# Patient Record
Sex: Female | Born: 1985 | Race: Black or African American | Hispanic: No | Smoking: Former smoker
Health system: Southern US, Community
[De-identification: ages and names within clinical notes are randomized; demographics above are authoritative.]

---

## 2006-11-14 ENCOUNTER — Emergency Department (HOSPITAL_COMMUNITY): Admission: EM | Admit: 2006-11-14 | Discharge: 2006-11-15 | Payer: Self-pay | Admitting: Emergency Medicine

## 2007-02-16 ENCOUNTER — Ambulatory Visit (HOSPITAL_COMMUNITY): Admission: RE | Admit: 2007-02-16 | Discharge: 2007-02-16 | Payer: Self-pay | Admitting: Obstetrics and Gynecology

## 2007-05-18 ENCOUNTER — Inpatient Hospital Stay (HOSPITAL_COMMUNITY): Admission: AD | Admit: 2007-05-18 | Discharge: 2007-05-21 | Payer: Self-pay | Admitting: Obstetrics and Gynecology

## 2010-12-14 NOTE — Discharge Summary (Signed)
NAMEMARLON, VONRUDEN                ACCOUNT NO.:  [id number]   MEDICAL RECORD NO.:  [id number]          PATIENT TYPE:  INP   LOCATION:  9127                          FACILITY:  [facility]   PHYSICIAN:  Malachi Pro. Ambrose Mantle, M.D. DATE OF BIRTH:  [birth year]   DATE OF ADMISSION:  05/18/2007  DATE OF DISCHARGE:  05/21/2007                               DISCHARGE SUMMARY   A 25 year old black single female, para 0-1-1-1, gravida 3, EDC May 28, 2007, admitted at 8 cm dilatation.  Her blood group and type was O+,  negative antibody, sickle cell negative, RPR nonreactive, rubella  immune, hepatitis B surface antigen negative, HIV negative, GC and  chlamydia negative, quad screen negative.  One-hour Glucola 81.  Group B  strep positive.  Cystic fibrosis negative. Ultrasound on January 02, 2007:  18 weeks 4 days, [facility] June 01, 2007. Repeat ultrasound on February 16, 2007, average gestational age [redacted] weeks 3 days, [facility] May 29, 2007.  Bilateral upper extremity polydactyly was seen.  The patient was treated  with Flagyl and Terazol for bacterial vaginosis and yeast. Prenatal care  was essentially uncomplicated.  On May 17, 2007, the cervix was 2-3  cm, 50%, vertex presentation. At approximately 9:30 p.m., the patient  began contracting. She came to the maternity admission unit was found to  be 8 cm dilated.  She was admitted, and, because of her positive group B  strep status and advanced cervical dilatation, she was begun on  ampicillin rather than penicillin for speed of administration. She  requested an epidural and was prepared for that.   PAST MEDICAL HISTORY:  Revealed no allergies.  No illnesses.  No  operations.  No alcohol, tobacco or drugs.   FAMILY HISTORY:  Maternal grandmother with high blood pressure.   OBSTETRICAL HISTORY:  1. In April 2005, the patient delivered a 6 pounds 2 ounces female      vaginally at 36 weeks and 6 days.  2. In 2007 early abortion.   PHYSICAL EXAMINATION ON  ADMISSION:  Revealed normal vital signs.  Heart  and lungs were normal.  The abdomen was soft. Fundal height was 37 cm.  Fetal heart tones were normal.  The cervix was 8 cm, 100% effaced,  vertex at a zero station per the admitting nurse. Her membranes ruptured  spontaneously at 1:15 a.m.   Impressions were intrauterine pregnancy at 38+ weeks, advanced cervical  dilatation, positive group B strep.   HOSPITAL COURSE:  IV ampicillin was begun.  The anesthesiologist was  called for a stat C-section. As he was preparing to give the epidural,  the patient progressed rapidly to full dilatation with the vertex LOP.  She delivered spontaneously LOP over a first-degree laceration a living  female infant, 6 pounds 2 ounces with Apgars of 9 at 1 and 9 at 5  minutes.  Placenta was intact.  The uterus was normal. First-degree  laceration repaired with 3-0 Vicryl.  Blood loss about 400 mL. Dr.  Ambrose Mantle was in attendance.   Postpartum, the patient did quite well and was discharged on the second  postpartum day.  Laboratory data showed initial hemoglobin of 12.4,  hematocrit 36.9, white count 10,700, platelet count 171,000.  Followup  hemoglobin was 9.9.  RPR was nonreactive.   FINAL DIAGNOSES:  1. Intrauterine pregnancy at 38+ weeks, delivered left      occipitoposterior position.  2. Positive group B Streptococcus.   OPERATION:  1. Spontaneous delivery left occipitoposterior position.  2. Repair of first-degree laceration.   FINAL CONDITION:  Improved.   INSTRUCTIONS:  1. Our regular discharge instruction booklet.  2. Percocet 5/325, #30 tablets, one every 4-6 hours as needed for      pain.  3. The patient is advised to return in 6 weeks for followup      examination.      Malachi Pro. Ambrose Mantle, M.D.  Electronically Signed     TFH/MEDQ  D:  05/21/2007  T:  05/21/2007  Job:  [id number]

## 2011-05-11 LAB — CBC
HCT: 36.9
Hemoglobin: 9.9 — ABNORMAL LOW
MCHC: 32.8
MCHC: 33.4
MCV: 86.8
Platelets: 162
Platelets: 171
RDW: 13.3
RDW: 13.9
WBC: 10.7 — ABNORMAL HIGH

## 2011-08-14 ENCOUNTER — Encounter (HOSPITAL_COMMUNITY): Payer: Self-pay | Admitting: *Deleted

## 2011-08-14 ENCOUNTER — Emergency Department (INDEPENDENT_AMBULATORY_CARE_PROVIDER_SITE_OTHER)
Admission: EM | Admit: 2011-08-14 | Discharge: 2011-08-14 | Disposition: A | Discharge status: Home / Self Care | Payer: Self-pay | Source: Home / Self Care

## 2011-08-14 DIAGNOSIS — L049 Acute lymphadenitis, unspecified: Secondary | ICD-10-CM

## 2011-08-14 MED ORDER — CEPHALEXIN 500 MG PO CAPS: 500.0000 mg | ORAL_CAPSULE | Freq: Three times a day (TID) | ORAL | Status: AC

## 2011-08-14 NOTE — ED Notes (Signed)
Pt with onset of sore throat  Thursday - swelling neck on Friday - swelling has improved - denies difficulty swallowing - sorethroat has resolved

## 2011-08-14 NOTE — ED Provider Notes (Signed)
History     CSN: [id number]  Arrival date & time 08/14/11  1723   None     Chief Complaint  Patient presents with  . Sore Throat  . Lymphadenopathy    (Consider location/radiation/quality/duration/timing/severity/associated sxs/prior treatment) HPI Comments: Sore throat 4 days ago - for only one day. The next day noticed a large nontender lump in her throat. Today that appears to be getting smaller. No dysphagia. She denies fever, nasal congestion or cough. She is not taking anything for her symptoms.    History reviewed. No pertinent past medical history.  History reviewed. No pertinent past surgical history.  History reviewed. No pertinent family history.  History  Substance Use Topics  . Smoking status: Never Smoker   . Smokeless tobacco: Not on file  . Alcohol Use: Yes    OB History    Grav Para Term Preterm Abortions TAB SAB Ect Mult Living                  Review of Systems  Constitutional: Negative for fever, chills and fatigue.  HENT: Negative for ear pain, sore throat, rhinorrhea, sneezing, mouth sores, trouble swallowing, neck pain, voice change, postnasal drip and sinus pressure.   Respiratory: Negative for cough.   Cardiovascular: Negative for chest pain.    Allergies  Review of patient's allergies indicates no known allergies.  Home Medications   Current Outpatient Rx  Name Route Sig Dispense Refill  . NYQUIL PO Oral Take by mouth.    . CEPHALEXIN 500 MG PO CAPS Oral Take 1 capsule (500 mg total) by mouth 3 (three) times daily. 30 capsule 0    BP 137/74  Pulse 63  Temp(Src) 99 F (37.2 C) (Oral)  Resp 16  SpO2 100%  LMP 08/09/2011  Physical Exam  Nursing note and vitals reviewed. Constitutional: She appears well-developed and well-nourished. No distress.  HENT:  Head: Normocephalic and atraumatic.  Right Ear: Tympanic membrane, external ear and ear canal normal.  Left Ear: Tympanic membrane, external ear and ear canal normal.  Nose:  Nose normal.  Mouth/Throat: Uvula is midline, oropharynx is clear and moist and mucous membranes are normal. No oropharyngeal exudate, posterior oropharyngeal edema or posterior oropharyngeal erythema.  Neck: Neck supple. No tracheal deviation present. No thyromegaly present.  Cardiovascular: Normal rate, regular rhythm and normal heart sounds.   Pulmonary/Chest: Effort normal and breath sounds normal. No respiratory distress.  Lymphadenopathy:       Head (right side): No submandibular and no tonsillar adenopathy present.       Head (left side): Submental adenopathy present. No submandibular and no tonsillar adenopathy present.    She has no cervical adenopathy.       Lt submental 1.5 cm hard smooth mobile nontender node.  Neurological: She is alert.  Skin: Skin is warm and dry.  Psychiatric: She has a normal mood and affect.    ED Course  Procedures (including critical care time)   Labs Reviewed  POCT RAPID STREP A ([facility] URG CARE ONLY)   No results found.   1. Lymphadenitis, acute       MDM  Solitary Lt submental adenopathy.         Melody Comas, [place] 08/19/11 1324

## 2011-08-19 NOTE — ED Provider Notes (Signed)
Medical screening examination/treatment/procedure(s) were performed by non-physician practitioner and as supervising physician I was immediately available for consultation/collaboration.   [facility]; MD   Sharin Grave, MD 08/19/11 1610

## 2013-01-04 ENCOUNTER — Emergency Department (INDEPENDENT_AMBULATORY_CARE_PROVIDER_SITE_OTHER)
Admission: EM | Admit: 2013-01-04 | Discharge: 2013-01-04 | Disposition: A | Discharge status: Home / Self Care | Payer: Self-pay | Source: Home / Self Care | Attending: Emergency Medicine | Admitting: Emergency Medicine

## 2013-01-04 ENCOUNTER — Encounter (HOSPITAL_COMMUNITY): Payer: Self-pay | Admitting: Emergency Medicine

## 2013-01-04 DIAGNOSIS — M791 Myalgia, unspecified site: Secondary | ICD-10-CM

## 2013-01-04 DIAGNOSIS — IMO0001 Reserved for inherently not codable concepts without codable children: Secondary | ICD-10-CM

## 2013-01-04 MED ORDER — IBUPROFEN 600 MG PO TABS: 600.0000 mg | ORAL_TABLET | Freq: Three times a day (TID) | ORAL | Status: DC | PRN

## 2013-01-04 NOTE — ED Provider Notes (Addendum)
History     CSN: [id number]  Arrival date & time 01/04/13  1418   First MD Initiated Contact with Patient 01/04/13 1604      Chief Complaint  Patient presents with  . Chest Pain    (Consider location/radiation/quality/duration/timing/severity/associated sxs/prior treatment) HPI Comments: Pt feels sx worse when takes bra off, like breast is pulling on area.   Patient is a 27 y.o. female presenting with chest pain. The history is provided by the patient.  Chest Pain Pain location:  L chest Pain quality: aching and tightness   Pain radiates to:  L shoulder Pain radiates to the back: no   Pain severity:  Mild Onset quality:  Gradual Duration:  2 weeks Timing:  Intermittent Progression:  Unchanged Chronicity:  New Relieved by:  None tried Exacerbated by: taking bra off. Ineffective treatments:  None tried Associated symptoms: no anxiety, no diaphoresis, no fever, no nausea, no palpitations and no shortness of breath     History reviewed. No pertinent past medical history.  History reviewed. No pertinent past surgical history.  History reviewed. No pertinent family history.  History  Substance Use Topics  . Smoking status: Current Every Day Smoker  . Smokeless tobacco: Not on file  . Alcohol Use: Yes    OB History   Grav Para Term Preterm Abortions TAB SAB Ect Mult Living                  Review of Systems  Constitutional: Negative for fever, chills and diaphoresis.  Respiratory: Negative for shortness of breath.   Cardiovascular: Positive for chest pain. Negative for palpitations and leg swelling.  Gastrointestinal: Negative for nausea.    Allergies  Review of patient's allergies indicates no known allergies.  Home Medications   Current Outpatient Rx  Name  Route  Sig  Dispense  Refill  . ibuprofen (ADVIL,MOTRIN) 600 MG tablet   Oral   Take 1 tablet (600 mg total) by mouth every 8 (eight) hours as needed for pain.   30 tablet   0   .  Pseudoeph-Doxylamine-DM-APAP (NYQUIL PO)   Oral   Take by mouth.           BP 150/77  Pulse 63  Temp(Src) 98.4 F (36.9 C) (Oral)  Resp 17  SpO2 100%  LMP 12/05/2012  Physical Exam  Constitutional: She appears well-developed and well-nourished. No distress.  Cardiovascular: Normal rate and regular rhythm.   Pulmonary/Chest: Effort normal and breath sounds normal. She exhibits tenderness. She exhibits no bony tenderness. Right breast exhibits no inverted nipple, no mass, no nipple discharge, no skin change and no tenderness. Left breast exhibits tenderness. Left breast exhibits no inverted nipple, no mass, no nipple discharge and no skin change.    Lymphadenopathy:       Right axillary: No pectoral and no lateral adenopathy present.       Left axillary: No pectoral and no lateral adenopathy present.      Right: No supraclavicular adenopathy present.       Left: No supraclavicular and no epitrochlear adenopathy present.    ED Course  Procedures (including critical care time)  Labs Reviewed - No data to display No results found.   1. Muscle pain       MDM  Most likely muscle pain. Breasts are not fibrocystic or dense. EKG NSR, 69bpm        Cathlyn Parsons, NP 01/04/13 1700  Cathlyn Parsons, NP 01/04/13 2143

## 2013-01-04 NOTE — ED Provider Notes (Signed)
Medical screening examination/treatment/procedure(s) were performed by non-physician practitioner and as supervising physician I was immediately available for consultation/collaboration.  Wyland Rastetter, M.D.  Masashi Snowdon C Khalfani Weideman, MD 01/04/13 2211 

## 2013-01-04 NOTE — ED Notes (Signed)
Reports chest tightness above left breast.  Sensation is tight, not pain.  Associates with pain in left arm.  Onset about 2 weeks ago.  Reports pain in left shoulder and arm is sharp.  Does not relate to any behavior the improvement or worsening of symptoms.  No diaphoresis, no sob, no nausea.  Reports "going to sleep" is the only thing that helps

## 2013-01-04 NOTE — ED Provider Notes (Signed)
Medical screening examination/treatment/procedure(s) were performed by non-physician practitioner and as supervising physician I was immediately available for consultation/collaboration.  Leslee Home, M.D.  Reuben Likes, MD 01/04/13 2104

## 2015-05-25 ENCOUNTER — Emergency Department (HOSPITAL_COMMUNITY): Payer: No Typology Code available for payment source

## 2015-05-25 ENCOUNTER — Emergency Department (HOSPITAL_COMMUNITY)
Admission: EM | Admit: 2015-05-25 | Discharge: 2015-05-25 | Disposition: A | Discharge status: Home / Self Care | Payer: No Typology Code available for payment source | Attending: Emergency Medicine | Admitting: Emergency Medicine

## 2015-05-25 ENCOUNTER — Encounter (HOSPITAL_COMMUNITY): Payer: Self-pay | Admitting: Cardiology

## 2015-05-25 DIAGNOSIS — Y998 Other external cause status: Secondary | ICD-10-CM | POA: Insufficient documentation

## 2015-05-25 DIAGNOSIS — R519 Headache, unspecified: Secondary | ICD-10-CM

## 2015-05-25 DIAGNOSIS — S0990XA Unspecified injury of head, initial encounter: Secondary | ICD-10-CM | POA: Insufficient documentation

## 2015-05-25 DIAGNOSIS — M25512 Pain in left shoulder: Secondary | ICD-10-CM

## 2015-05-25 DIAGNOSIS — S4992XA Unspecified injury of left shoulder and upper arm, initial encounter: Secondary | ICD-10-CM | POA: Diagnosis present

## 2015-05-25 DIAGNOSIS — Y9389 Activity, other specified: Secondary | ICD-10-CM | POA: Insufficient documentation

## 2015-05-25 DIAGNOSIS — Z87891 Personal history of nicotine dependence: Secondary | ICD-10-CM | POA: Diagnosis not present

## 2015-05-25 DIAGNOSIS — Y92410 Unspecified street and highway as the place of occurrence of the external cause: Secondary | ICD-10-CM | POA: Diagnosis not present

## 2015-05-25 DIAGNOSIS — R51 Headache: Secondary | ICD-10-CM

## 2015-05-25 MED ORDER — IBUPROFEN 400 MG PO TABS
800.0000 mg | ORAL_TABLET | Freq: Once | ORAL | Status: AC
  Administered 2015-05-25: 800 mg via ORAL
  Filled 2015-05-25: qty 2

## 2015-05-25 MED ORDER — IBUPROFEN 600 MG PO TABS: 600.0000 mg | ORAL_TABLET | Freq: Four times a day (QID) | ORAL | Status: DC | PRN
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
## 2015-05-25 NOTE — Discharge Instructions (Signed)
Please use ibuprofen, ice, rest. Please contact orthopedic specialist if symptoms continue to persist, please complete shoulder exercises.

## 2016-08-09 ENCOUNTER — Encounter (HOSPITAL_COMMUNITY): Payer: Self-pay | Admitting: Emergency Medicine

## 2016-08-09 ENCOUNTER — Emergency Department (HOSPITAL_COMMUNITY)
Admission: EM | Admit: 2016-08-09 | Discharge: 2016-08-09 | Disposition: A | Discharge status: Home / Self Care | Payer: Self-pay | Attending: Emergency Medicine | Admitting: Emergency Medicine

## 2016-08-09 DIAGNOSIS — K0889 Other specified disorders of teeth and supporting structures: Secondary | ICD-10-CM | POA: Insufficient documentation

## 2016-08-09 DIAGNOSIS — Z87891 Personal history of nicotine dependence: Secondary | ICD-10-CM | POA: Insufficient documentation

## 2016-08-09 MED ORDER — PENICILLIN V POTASSIUM 500 MG PO TABS: 500.0000 mg | ORAL_TABLET | Freq: Four times a day (QID) | ORAL | 0 refills | Status: AC

## 2016-08-09 MED ORDER — NAPROXEN 375 MG PO TABS: 375.0000 mg | ORAL_TABLET | Freq: Two times a day (BID) | ORAL | 0 refills | Status: DC

## 2016-08-09 NOTE — Discharge Instructions (Signed)
Please take the antibiotic as prescribed for 7 days. You need to take it 4 times a day. Make sure that you spread out the dose throughout the day. You may take the naproxen for pain. May also take Tylenol. You need a follow-up with a dentist. I have given you a dental resource guide. Return to the ED if you have any difficulty swallowing, difficulties breathing, develop fevers, worsening pain, worsening swelling.

## 2016-08-09 NOTE — ED Triage Notes (Signed)
Had a rt sided tooth ache  Yesterday woke up today and  Face on rt was swollen.  Cold air bothers it

## 2016-08-09 NOTE — ED Provider Notes (Signed)
[facility]-EMERGENCY DEPT Provider Note   CSN: [id number] Arrival date & time: 08/09/16  4098  By signing my name below, I, Majel Homer, attest that this documentation has been prepared under the direction and in the presence of Demetrios Loll, PA-C . Electronically Signed: Majel Homer, Scribe. 08/09/2016. 9:52 AM.  History   Chief Complaint Chief Complaint  Patient presents with  . Dental Pain   The history is provided by the patient. No language interpreter was used.   HPI Comments: Jody Brady is a 31 y.o. female who presents to the Emergency Department complaining of gradually improving, right upper dental pain that began last night. Pt reports she took Tylenol last night for her pain with complete relief and woke up this morning with "numbness" surrounding her tooth. She states associated mild right sided facial swelling that also began this morning. Cold air makes pain worse. She notes hx of similar dental pain ~1 year ago in which she visited a dentist and "had 3 teeth pulled." She states she does not currently have a dentist. Pt denies fever, shortness of breath, and difficulty swallowing.   History reviewed. No pertinent past medical history.  There are no active problems to display for this patient.  History reviewed. No pertinent surgical history.  OB History    No data available     Home Medications    Prior to Admission medications   Medication Sig Start Date End Date Taking? Authorizing Provider  ibuprofen (ADVIL,MOTRIN) 600 MG tablet Take 1 tablet (600 mg total) by mouth every 6 (six) hours as needed. 05/25/15   Jeffrey Hedges, PA-C  Pseudoeph-Doxylamine-DM-APAP (NYQUIL PO) Take by mouth.    Historical Provider, MD    Family History No family history on file.  Social History Social History  Substance Use Topics  . Smoking status: Former Games developer  . Smokeless tobacco: Never Used  . Alcohol use Yes     Allergies   Patient has no known allergies.  Review of  Systems Review of Systems  Constitutional: Negative for fever.  HENT: Positive for dental problem and facial swelling. Negative for trouble swallowing.   Respiratory: Negative for shortness of breath.   All other systems reviewed and are negative.  Physical Exam Updated Vital Signs BP 131/84 (BP Location: Right Arm)   Pulse 71   Temp 98.2 F (36.8 C) (Oral)   Resp 18   Ht 5\' 5"  (1.651 m)   Wt 160 lb (72.6 kg)   SpO2 100%   BMI 26.63 kg/m   Physical Exam  Constitutional: She is oriented to person, place, and time. She appears well-developed and well-nourished.  HENT:  Head: Normocephalic and atraumatic.  Right Ear: Tympanic membrane, external ear and ear canal normal.  Left Ear: Tympanic membrane, external ear and ear canal normal.  Nose: Nose normal.  Mouth/Throat: Uvula is midline, oropharynx is clear and moist and mucous membranes are normal. No trismus in the jaw.    No sublingual or submandibular swelling. Oropharynx is clear without any edema. Managing secretions and maintaining airway. No trismus. Uvula is midline. No gross abscess noted. Mild facial swelling of the right side. Mild tenderness to palpation over the tooth. No erythema, warmth of right side of face.  Eyes: EOM are normal.  Neck: Normal range of motion.  Pulmonary/Chest: Effort normal.  Abdominal: She exhibits no distension.  Musculoskeletal: Normal range of motion.  Neurological: She is alert and oriented to person, place, and time.  Psychiatric: She has a normal mood  and affect.  Nursing note and vitals reviewed.  ED Treatments / Results  Labs (all labs ordered are listed, but only abnormal results are displayed) Labs Reviewed - No data to display  EKG  EKG Interpretation None       Radiology No results found.  Procedures Procedures (including critical care time)  Medications Ordered in ED Medications - No data to display  DIAGNOSTIC STUDIES:  Oxygen Saturation is 100% on RA, normal  by my interpretation.    COORDINATION OF CARE:  9:51 AM Discussed treatment plan with pt at bedside and pt agreed to plan.  Initial Impression / Assessment and Plan / ED Course  I have reviewed the triage vital signs and the nursing notes.  Pertinent labs & imaging results that were available during my care of the patient were reviewed by me and considered in my medical decision making (see chart for details).  Clinical Course    Patient with dentalgia.  No abscess requiring immediate incision and drainage.  Exam not concerning for Ludwig's angina or pharyngeal abscess.  Patient managing her airway and maintaining secretions. Patient is afebrile and not tachycardic. Will treat with PCN and pain medication. Pt instructed to follow-up with dentist.  Discussed return precautions. Pt safe for discharge.  I personally performed the services described in this documentation, which was scribed in my presence. The recorded information has been reviewed and is accurate.   Final Clinical Impressions(s) / ED Diagnoses   Final diagnoses:  Pain, dental    New Prescriptions New Prescriptions   NAPROXEN (NAPROSYN) 375 MG TABLET    Take 1 tablet (375 mg total) by mouth 2 (two) times daily.   PENICILLIN V POTASSIUM (VEETID) 500 MG TABLET    Take 1 tablet (500 mg total) by mouth 4 (four) times daily.     Rise MuKenneth T Cadden Elizondo, PA-C 08/09/16 1002    Lavera Guiseana Duo Liu, MD 08/09/16 1725

## 2019-06-05 ENCOUNTER — Other Ambulatory Visit (HOSPITAL_COMMUNITY)
Admission: RE | Admit: 2019-06-05 | Discharge: 2019-06-05 | Disposition: A | Discharge status: Home / Self Care | Payer: 59 | Source: Ambulatory Visit | Attending: Family Medicine | Admitting: Family Medicine

## 2019-06-05 ENCOUNTER — Other Ambulatory Visit: Payer: Self-pay

## 2019-06-05 ENCOUNTER — Encounter: Payer: Self-pay | Admitting: Family Medicine

## 2019-06-05 ENCOUNTER — Ambulatory Visit: Payer: 59 | Admitting: Family Medicine

## 2019-06-05 VITALS — BP 120/70 | HR 63 | Temp 97.1°F | Ht 65.5 in | Wt 165.4 lb

## 2019-06-05 DIAGNOSIS — Z113 Encounter for screening for infections with a predominantly sexual mode of transmission: Secondary | ICD-10-CM

## 2019-06-05 DIAGNOSIS — Z13220 Encounter for screening for lipoid disorders: Secondary | ICD-10-CM | POA: Diagnosis not present

## 2019-06-05 DIAGNOSIS — Z1329 Encounter for screening for other suspected endocrine disorder: Secondary | ICD-10-CM

## 2019-06-05 DIAGNOSIS — Z23 Encounter for immunization: Secondary | ICD-10-CM

## 2019-06-05 DIAGNOSIS — Z124 Encounter for screening for malignant neoplasm of cervix: Secondary | ICD-10-CM | POA: Diagnosis present

## 2019-06-05 DIAGNOSIS — N898 Other specified noninflammatory disorders of vagina: Secondary | ICD-10-CM

## 2019-06-05 DIAGNOSIS — Z0000 Encounter for general adult medical examination without abnormal findings: Secondary | ICD-10-CM | POA: Diagnosis not present

## 2019-06-05 NOTE — Patient Instructions (Signed)
Preventive Care 21-34 Years Old, Female Preventive care refers to visits with your health care provider and lifestyle choices that can promote health and wellness. This includes:  A yearly physical exam. This may also be called an annual well check.  Regular dental visits and eye exams.  Immunizations.  Screening for certain conditions.  Healthy lifestyle choices, such as eating a healthy diet, getting regular exercise, not using drugs or products that contain nicotine and tobacco, and limiting alcohol use. What can I expect for my preventive care visit? Physical exam Your health care provider will check your:  Height and weight. This may be used to calculate body mass index (BMI), which tells if you are at a healthy weight.  Heart rate and blood pressure.  Skin for abnormal spots. Counseling Your health care provider may ask you questions about your:  Alcohol, tobacco, and drug use.  Emotional well-being.  Home and relationship well-being.  Sexual activity.  Eating habits.  Work and work environment.  Method of birth control.  Menstrual cycle.  Pregnancy history. What immunizations do I need?  Influenza (flu) vaccine  This is recommended every year. Tetanus, diphtheria, and pertussis (Tdap) vaccine  You may need a Td booster every 10 years. Varicella (chickenpox) vaccine  You may need this if you have not been vaccinated. Human papillomavirus (HPV) vaccine  If recommended by your health care provider, you may need three doses over 6 months. Measles, mumps, and rubella (MMR) vaccine  You may need at least one dose of MMR. You may also need a second dose. Meningococcal conjugate (MenACWY) vaccine  One dose is recommended if you are age 19-21 years and a first-year college student living in a residence hall, or if you have one of several medical conditions. You may also need additional booster doses. Pneumococcal conjugate (PCV13) vaccine  You may need  this if you have certain conditions and were not previously vaccinated. Pneumococcal polysaccharide (PPSV23) vaccine  You may need one or two doses if you smoke cigarettes or if you have certain conditions. Hepatitis A vaccine  You may need this if you have certain conditions or if you travel or work in places where you may be exposed to hepatitis A. Hepatitis B vaccine  You may need this if you have certain conditions or if you travel or work in places where you may be exposed to hepatitis B. Haemophilus influenzae type b (Hib) vaccine  You may need this if you have certain conditions. You may receive vaccines as individual doses or as more than one vaccine together in one shot (combination vaccines). Talk with your health care provider about the risks and benefits of combination vaccines. What tests do I need?  Blood tests  Lipid and cholesterol levels. These may be checked every 5 years starting at age 20.  Hepatitis C test.  Hepatitis B test. Screening  Diabetes screening. This is done by checking your blood sugar (glucose) after you have not eaten for a while (fasting).  Sexually transmitted disease (STD) testing.  BRCA-related cancer screening. This may be done if you have a family history of breast, ovarian, tubal, or peritoneal cancers.  Pelvic exam and Pap test. This may be done every 3 years starting at age 21. Starting at age 30, this may be done every 5 years if you have a Pap test in combination with an HPV test. Talk with your health care provider about your test results, treatment options, and if necessary, the need for more tests.   Follow these instructions at home: Eating and drinking   Eat a diet that includes fresh fruits and vegetables, whole grains, lean protein, and low-fat dairy.  Take vitamin and mineral supplements as recommended by your health care provider.  Do not drink alcohol if: ? Your health care provider tells you not to drink. ? You are  pregnant, may be pregnant, or are planning to become pregnant.  If you drink alcohol: ? Limit how much you have to 0-1 drink a day. ? Be aware of how much alcohol is in your drink. In the [place] one drink equals one 12 oz bottle of beer (355 mL), one 5 oz glass of wine (148 mL), or one 1 oz glass of hard liquor (44 mL). Lifestyle  Take daily care of your teeth and gums.  Stay active. Exercise for at least 30 minutes on 5 or more days each week.  Do not use any products that contain nicotine or tobacco, such as cigarettes, e-cigarettes, and chewing tobacco. If you need help quitting, ask your health care provider.  If you are sexually active, practice safe sex. Use a condom or other form of birth control (contraception) in order to prevent pregnancy and STIs (sexually transmitted infections). If you plan to become pregnant, see your health care provider for a preconception visit. What's next?  Visit your health care provider once a year for a well check visit.  Ask your health care provider how often you should have your eyes and teeth checked.  Stay up to date on all vaccines. This information is not intended to replace advice given to you by your health care provider. Make sure you discuss any questions you have with your health care provider. Document Released: 09/13/2001 Document Revised: 03/29/2018 Document Reviewed: 03/29/2018 Elsevier Patient Education  2020 Elsevier Inc.  

## 2019-06-05 NOTE — Progress Notes (Signed)
Subjective:    Patient ID: Jody Brady, female    DOB: 1986-03-09, 33 y.o.   MRN: [id number]  HPI Chief Complaint  Patient presents with  . new pt    new pt, cpe. think she has yeast infection. no obgyn.Marland Kitchen declines flu shot   She is new to the practice and here for a complete physical exam. Moved to [place] 12 years ago.  Previous medical care: no PCP or regular care  Last CPE: 2 years ago.   Other providers: Dentist   Social history: Lives with boyfriend, son and daughter, works at [employer]  Denies smoking, drug use. Alcohol use social.  Diet: has cut back on soda. Green tea and water. No particular diet  Excerise: none   Depo Provera in the past and birth control pills before. Does not want birth control   Immunizations: flu shot declines. Tdap unknown.   Health maintenance:  Mammogram: N/A Colonoscopy: N/A Last Gynecological Exam: 2-3  years ago. Denies having abnormal pap smears in the past.  Last Menstrual cycle: 05/31/2019 Last Dental Exam: in 2020  Last Eye Exam:  Years ago   Wears seatbelt always, smoke detectors in home and functioning, does not text while driving and feels safe in home environment.   Reviewed allergies, medications, past medical, surgical, family, and social history.   Review of Systems Review of Systems Constitutional: -fever, -chills, -sweats, -unexpected weight change,-fatigue ENT: -runny nose, -ear pain, -sore throat Cardiology:  -chest pain, -palpitations, -edema Respiratory: -cough, -shortness of breath, -wheezing Gastroenterology: -abdominal pain, -nausea, -vomiting, -diarrhea, -constipation  Hematology: -bleeding or bruising problems Musculoskeletal: -arthralgias, -myalgias, -joint swelling, -back pain Ophthalmology: -vision changes Urology: -dysuria, -difficulty urinating, -hematuria, -urinary frequency, -urgency Neurology: -headache, -weakness, -tingling, -numbness       Objective:   Physical Exam  BP 120/70   Pulse 63   Temp (!) 97.1 F (36.2 C)   Ht 5' 5.5" (1.664 m)   Wt 165 lb 6.4 oz (75 kg)   LMP 05/31/2019   BMI 27.11 kg/m   General Appearance:    Alert, cooperative, no distress, appears stated age  Head:    Normocephalic, without obvious abnormality, atraumatic  Eyes:    PERRL, conjunctiva/corneas clear, EOM's intact  Ears:    Normal TM's and external ear canals  Nose:   Mask in place   Throat:   Mask in place   Neck:   Supple, no lymphadenopathy;  thyroid:  no   enlargement/tenderness/nodules; no carotid   bruit or JVD  Back:    Spine nontender, no curvature, ROM normal, no CVA     tenderness  Lungs:     Clear to auscultation bilaterally without wheezes, rales or     ronchi; respirations unlabored  Chest Wall:    No tenderness or deformity   Heart:    Regular rate and rhythm, S1 and S2 normal, no murmur, rub   or gallop  Breast Exam:    No tenderness, masses, or nipple discharge or inversion.      No axillary lymphadenopathy  Abdomen:     Soft, non-tender, nondistended, normoactive bowel sounds,    no masses, no hepatosplenomegaly  Genitalia:    Normal external genitalia without lesions.  BUS and vagina normal; cervix without lesions, or cervical motion tenderness. No abnormal vaginal discharge.  Uterus and adnexa not enlarged, nontender, no masses.  Pap performed. Chaperone present.   Rectal:    Not performed due to age<40 and no related complaints  Extremities:   No clubbing, cyanosis or edema  Pulses:   2+ and symmetric all extremities  Skin:   Skin color, texture, turgor normal, no rashes or lesions  Lymph nodes:   Cervical, supraclavicular, and axillary nodes normal  Neurologic:   CNII-XII intact, normal strength, sensation and gait; reflexes 2+ and symmetric throughout          Psych:   Normal mood, affect, hygiene and grooming.         Assessment & Plan:  Routine general medical examination at a health care facility - Plan: CBC with Differential/Platelet,  Comprehensive metabolic panel, TSH, T4, free, Lipid panel -She is new to the practice and here today for a fasting CPE. Regular medical care in years. Preventive healthcare reviewed and updated.  Pap smear was done.  Immunizations reviewed and updated as well.  She declines flu shot. Counseled on healthy lifestyle and safety Counseling on birth control and she declines.  States she would be fine if she were to get pregnant.  Screening for thyroid disorder - Plan: TSH, T4, free  Screening for lipid disorders - Plan: Lipid panel  Screen for STD (sexually transmitted disease) - Plan: HIV Antibody (routine testing w rflx), RPR, NuSwab Vaginitis Plus (VG+)  Need for diphtheria-tetanus-pertussis (Tdap) vaccine - Plan: Tdap vaccine greater than or equal to 7yo IM -Counseling done with positive vaccine  Screening for cervical cancer - Plan: Cytology - PAP([place]) -Done per guidelines.  Chaperone present.   Vaginal discharge - Plan: NuSwab Vaginitis Plus (VG+) -This has been ongoing for weeks per patient.  Screen for STDs and follow-up

## 2019-06-06 LAB — CBC WITH DIFFERENTIAL/PLATELET
Basophils Absolute: 0 10*3/uL (ref 0.0–0.2)
Basos: 1 %
EOS (ABSOLUTE): 0.1 10*3/uL (ref 0.0–0.4)
Eos: 2 %
Hematocrit: 36.7 % (ref 34.0–46.6)
Hemoglobin: 12.3 g/dL (ref 11.1–15.9)
Immature Grans (Abs): 0 10*3/uL (ref 0.0–0.1)
Immature Granulocytes: 0 %
Lymphocytes Absolute: 2.5 10*3/uL (ref 0.7–3.1)
Lymphs: 47 %
MCH: 29.4 pg (ref 26.6–33.0)
MCHC: 33.5 g/dL (ref 31.5–35.7)
MCV: 88 fL (ref 79–97)
Monocytes Absolute: 0.4 10*3/uL (ref 0.1–0.9)
Monocytes: 8 %
Neutrophils Absolute: 2.2 10*3/uL (ref 1.4–7.0)
Neutrophils: 42 %
Platelets: 278 10*3/uL (ref 150–450)
RBC: 4.19 x10E6/uL (ref 3.77–5.28)
RDW: 12.5 % (ref 11.7–15.4)
WBC: 5.2 10*3/uL (ref 3.4–10.8)

## 2019-06-06 LAB — LIPID PANEL
Chol/HDL Ratio: 1.7 ratio (ref 0.0–4.4)
Cholesterol, Total: 134 mg/dL (ref 100–199)
HDL: 79 mg/dL (ref >39)
LDL Chol Calc (NIH): 45 mg/dL (ref 0–99)
Triglycerides: 41 mg/dL (ref 0–149)
VLDL Cholesterol Cal: 10 mg/dL (ref 5–40)

## 2019-06-06 LAB — COMPREHENSIVE METABOLIC PANEL
ALT: 19 IU/L (ref 0–32)
AST: 19 IU/L (ref 0–40)
Albumin/Globulin Ratio: 1.8 (ref 1.2–2.2)
Albumin: 4.1 g/dL (ref 3.8–4.8)
Alkaline Phosphatase: 57 IU/L (ref 39–117)
BUN/Creatinine Ratio: 18 (ref 9–23)
BUN: 11 mg/dL (ref 6–20)
Bilirubin Total: 0.6 mg/dL (ref 0.0–1.2)
CO2: 21 mmol/L (ref 20–29)
Calcium: 9.3 mg/dL (ref 8.7–10.2)
Chloride: 105 mmol/L (ref 96–106)
Creatinine, Ser: 0.61 mg/dL (ref 0.57–1.00)
GFR calc Af Amer: 137 mL/min/{1.73_m2} (ref >59)
GFR calc non Af Amer: 119 mL/min/{1.73_m2} (ref >59)
Globulin, Total: 2.3 g/dL (ref 1.5–4.5)
Glucose: 79 mg/dL (ref 65–99)
Potassium: 3.7 mmol/L (ref 3.5–5.2)
Sodium: 139 mmol/L (ref 134–144)
Total Protein: 6.4 g/dL (ref 6.0–8.5)

## 2019-06-06 LAB — RPR: RPR Ser Ql: NONREACTIVE

## 2019-06-06 LAB — HIV ANTIBODY (ROUTINE TESTING W REFLEX): HIV Screen 4th Generation wRfx: NONREACTIVE

## 2019-06-06 LAB — T4, FREE: Free T4: 1.24 ng/dL (ref 0.82–1.77)

## 2019-06-06 LAB — TSH: TSH: 0.986 u[IU]/mL (ref 0.450–4.500)

## 2019-06-07 ENCOUNTER — Other Ambulatory Visit: Payer: Self-pay | Admitting: Family Medicine

## 2019-06-07 DIAGNOSIS — B9689 Other specified bacterial agents as the cause of diseases classified elsewhere: Secondary | ICD-10-CM

## 2019-06-07 DIAGNOSIS — N760 Acute vaginitis: Secondary | ICD-10-CM

## 2019-06-07 LAB — NUSWAB VAGINITIS PLUS (VG+)
Atopobium vaginae: HIGH Score — AB
BVAB 2: HIGH Score — AB
Candida albicans, NAA: NEGATIVE
Candida glabrata, NAA: NEGATIVE
Chlamydia trachomatis, NAA: NEGATIVE
Megasphaera 1: HIGH Score — AB
Neisseria gonorrhoeae, NAA: NEGATIVE
Trich vag by NAA: NEGATIVE

## 2019-06-07 MED ORDER — METRONIDAZOLE 500 MG PO TABS: 500.0000 mg | ORAL_TABLET | Freq: Two times a day (BID) | ORAL | 0 refills | Status: DC

## 2019-06-10 LAB — CYTOLOGY - PAP
Comment: NEGATIVE
Diagnosis: NEGATIVE
High risk HPV: NEGATIVE

## 2020-01-13 ENCOUNTER — Ambulatory Visit (INDEPENDENT_AMBULATORY_CARE_PROVIDER_SITE_OTHER): Payer: 59 | Admitting: Family Medicine

## 2020-01-13 ENCOUNTER — Other Ambulatory Visit: Payer: Self-pay

## 2020-01-13 ENCOUNTER — Encounter: Payer: Self-pay | Admitting: Family Medicine

## 2020-01-13 VITALS — BP 128/80 | HR 66 | Temp 97.8°F | Wt 174.6 lb

## 2020-01-13 DIAGNOSIS — R110 Nausea: Secondary | ICD-10-CM

## 2020-01-13 DIAGNOSIS — N926 Irregular menstruation, unspecified: Secondary | ICD-10-CM | POA: Diagnosis not present

## 2020-01-13 DIAGNOSIS — R350 Frequency of micturition: Secondary | ICD-10-CM

## 2020-01-13 DIAGNOSIS — Z3201 Encounter for pregnancy test, result positive: Secondary | ICD-10-CM

## 2020-01-13 DIAGNOSIS — N3001 Acute cystitis with hematuria: Secondary | ICD-10-CM | POA: Diagnosis not present

## 2020-01-13 LAB — POCT URINALYSIS DIP (PROADVANTAGE DEVICE)
Bilirubin, UA: NEGATIVE
Glucose, UA: NEGATIVE mg/dL
Ketones, POC UA: NEGATIVE mg/dL
Nitrite, UA: NEGATIVE
Protein Ur, POC: NEGATIVE mg/dL
Specific Gravity, Urine: 1.015
Urobilinogen, Ur: 0.2
pH, UA: 6 (ref 5.0–8.0)

## 2020-01-13 LAB — POCT URINE PREGNANCY: Preg Test, Ur: POSITIVE — AB

## 2020-01-13 MED ORDER — CEPHALEXIN 500 MG PO CAPS: 500.0000 mg | ORAL_CAPSULE | Freq: Four times a day (QID) | ORAL | 0 refills | Status: DC

## 2020-01-13 NOTE — Progress Notes (Signed)
° °  Subjective:    Patient ID: Jody Brady, female    DOB: Jun 20, 1986, 34 y.o.   MRN: [id number]  HPI Chief Complaint  Patient presents with   other    possible UTI started about two weeks ago   Complains of a 2 week history of urinary frequency, urgency, and dysuria. States her urine has had a bad odor and cloudy.  No fever, chills, back pain, vomiting or diarrhea.  Denies history of recurrent UTI or pyelonephritis.   States she had nausea this morning.  She had lower abd cramping that resolved.   States she is not concerned about an STI. States she is in a monogamous long term relationship.   LMP: 11/30/2019  States her period is late. Has not taken a home pregnancy test. Is not trying to get pregnant. She has 2 kids.   Reviewed allergies, medications, past medical, surgical, family, and social history.   Review of Systems Pertinent positives and negatives in the history of present illness.     Objective:   Physical Exam BP 128/80    Pulse 66    Temp 97.8 F (36.6 C)    Wt 174 lb 9.6 oz (79.2 kg)    LMP 11/30/2019    SpO2 99%    BMI 28.61 kg/m   Alert and in no distress. Cardiac exam shows a regular sinus rhythm without murmurs or gallops. Lungs are clear to auscultation. Abdomen soft, nondistended, normal BS, non tender, no guarding or rebound. No CVAT. GU exam not done.        Assessment & Plan:  Acute cystitis with hematuria - Plan: Urine Culture, cephALEXin (KEFLEX) 500 MG capsule -I will treat her for UTI.  I will also send her urine for culture.  Discussed increasing water intake.  Positive urine pregnancy test - Plan: Beta hCG quant (ref lab) -Discussed that her urine pregnancy test is positive.  She is surprised since she was not attempting to get pregnant but states she is okay with this.  Counseling on avoiding any harmful medications or or any harmful behaviors now that she is pregnant.  Recommend she start on a prenatal vitamin.  We will check a pregnancy  blood test for more information.  Recommend she follow-up with her OB/GYN. Advised that if she develops severe lower abdominal pain at any point that that needs to be evaluated immediately due to the possibility of ectopic pregnancy early on.  She verbalized understanding.  Late menses - Plan: POCT urine pregnancy, Beta hCG quant (ref lab) -See above note on positive urine pregnancy test  Nausea -Most likely due to pregnancy.  Urinary frequency - Plan: POCT Urinalysis DIP (Proadvantage Device) -Treat for acute cystitis.  Supportive care discussed

## 2020-01-13 NOTE — Patient Instructions (Signed)
Pregnancy and Urinary Tract Infection ° °A urinary tract infection (UTI) is an infection of any part of the urinary tract. This includes the kidneys, the tubes that connect your kidneys to your bladder (ureters), the bladder, and the tube that carries urine out of your body (urethra). These organs make, store, and get rid of urine in the body. Your health care provider may use other names to describe the infection. An upper UTI affects the ureters and kidneys (pyelonephritis). A lower UTI affects the bladder (cystitis) and urethra (urethritis). °Most urinary tract infections are caused by bacteria in your genital area, around the entrance to your urinary tract (urethra). These bacteria grow and cause irritation and inflammation of your urinary tract. You are more likely to develop a UTI during pregnancy because the physical and hormonal changes your body goes through can make it easier for bacteria to get into your urinary tract. Your growing baby also puts pressure on your bladder and can affect urine flow. It is important to recognize and treat UTIs in pregnancy because of the risk of serious complications for both you and your baby. °How does this affect me? °Symptoms of a UTI include: °· Needing to urinate right away (urgently). °· Frequent urination or passing small amounts of urine frequently. °· Pain or burning with urination. °· Blood in the urine. °· Urine that smells bad or unusual. °· Trouble urinating. °· Cloudy urine. °· Pain in the abdomen or lower back. °· Vaginal discharge. °You may also have: °· Vomiting or a decreased appetite. °· Confusion. °· Irritability or tiredness. °· A fever. °· Diarrhea. °How does this affect my baby? °An untreated UTI during pregnancy could lead to a kidney infection or a systemic infection, which can cause health problems that could affect your baby. Possible complications of an untreated UTI include: °· Giving birth to your baby before 37 weeks of pregnancy  (premature). °· Having a baby with a low birth weight. °· Developing high blood pressure during pregnancy (preeclampsia). °· Having a low hemoglobin level (anemia). °What can I do to lower my risk? °To prevent a UTI: °· Go to the bathroom as soon as you feel the need. Do not hold urine for long periods of time. °· Always wipe from front to back, especially after a bowel movement. Use each tissue one time when you wipe. °· Empty your bladder after sex. °· Keep your genital area dry. °· Drink 6-10 glasses of water each day. °· Do not douche or use deodorant sprays. °How is this treated? °Treatment for this condition may include: °· Antibiotic medicines that are safe to take during pregnancy. °· Other medicines to treat less common causes of UTI. °Follow these instructions at home: °· If you were prescribed an antibiotic medicine, take it as told by your health care provider. Do not stop using the antibiotic even if you start to feel better. °· Keep all follow-up visits as told by your health care provider. This is important. °Contact a health care provider if: °· Your symptoms do not improve or they get worse. °· You have abnormal vaginal discharge. °Get help right away if you: °· Have a fever. °· Have nausea and vomiting. °· Have back or side pain. °· Feel contractions in your uterus. °· Have lower belly pain. °· Have a gush of fluid from your vagina. °· Have blood in your urine. °Summary °· A urinary tract infection (UTI) is an infection of any part of the urinary tract, which includes the   kidneys, ureters, bladder, and urethra. °· Most urinary tract infections are caused by bacteria in your genital area, around the entrance to your urinary tract (urethra). °· You are more likely to develop a UTI during pregnancy. °· If you were prescribed an antibiotic medicine, take it as told by your health care provider. Do not stop using the antibiotic even if you start to feel better. °This information is not intended to  replace advice given to you by your health care provider. Make sure you discuss any questions you have with your health care provider. °Document Revised: 11/09/2018 Document Reviewed: 06/21/2018 °Elsevier Patient Education © 2020 Elsevier Inc. ° °

## 2020-01-14 LAB — BETA HCG QUANT (REF LAB): hCG Quant: 38339 m[IU]/mL

## 2020-01-14 NOTE — Progress Notes (Signed)
Her pregnancy blood test shows that she is somewhere between 7 and [redacted] weeks pregnant. I recommend that she call and schedule with her OB/GYN now. We will be in touch with her urine culture result. Did she start the antibiotic?

## 2020-01-15 LAB — URINE CULTURE

## 2020-01-15 NOTE — Progress Notes (Signed)
Please let her know that the antibiotic she is taking, Keflex, should take care of the organism causing her urinary tract infection. If not, please follow up with me or her OB/GYN. Ask if she has scheduled a visit with an OB/GYN yet about her pregnancy.

## 2020-01-16 ENCOUNTER — Other Ambulatory Visit: Payer: Self-pay | Admitting: *Deleted

## 2020-01-16 DIAGNOSIS — D649 Anemia, unspecified: Secondary | ICD-10-CM

## 2020-01-16 DIAGNOSIS — Z1211 Encounter for screening for malignant neoplasm of colon: Secondary | ICD-10-CM

## 2020-02-19 ENCOUNTER — Other Ambulatory Visit: Payer: Self-pay

## 2020-02-19 ENCOUNTER — Encounter: Payer: Self-pay | Admitting: Medical

## 2020-02-19 ENCOUNTER — Ambulatory Visit (INDEPENDENT_AMBULATORY_CARE_PROVIDER_SITE_OTHER): Payer: 59 | Admitting: Medical

## 2020-02-19 VITALS — BP 130/80 | HR 70 | Temp 98.7°F | Ht 65.0 in | Wt 172.2 lb

## 2020-02-19 DIAGNOSIS — R350 Frequency of micturition: Secondary | ICD-10-CM | POA: Insufficient documentation

## 2020-02-19 DIAGNOSIS — Z8759 Personal history of other complications of pregnancy, childbirth and the puerperium: Secondary | ICD-10-CM | POA: Diagnosis not present

## 2020-02-19 DIAGNOSIS — N898 Other specified noninflammatory disorders of vagina: Secondary | ICD-10-CM | POA: Diagnosis not present

## 2020-02-19 LAB — POCT WET PREP (WET MOUNT)

## 2020-02-19 LAB — POCT URINALYSIS DIP (PROADVANTAGE DEVICE)
Bilirubin, UA: NEGATIVE
Glucose, UA: NEGATIVE mg/dL
Ketones, POC UA: NEGATIVE mg/dL
Leukocytes, UA: NEGATIVE
Nitrite, UA: NEGATIVE
Protein Ur, POC: NEGATIVE mg/dL
Specific Gravity, Urine: 1.02
Urobilinogen, Ur: NEGATIVE
pH, UA: 6.5 (ref 5.0–8.0)

## 2020-02-19 MED ORDER — FLUCONAZOLE 150 MG PO TABS: ORAL_TABLET | ORAL | 0 refills | Status: DC

## 2020-02-19 NOTE — Progress Notes (Signed)
Subjective:   Jody Brady is a 34 y.o. female who complains of possible urinary tract infection.  Chief Complaint  Patient presents with  . Urinary Tract Infection    odor with urnaition-had frequent urination but it stopped-thick whitew discharge    Saw Vickie 01/13/20 for same.  Was put on antibiotic, but doesn't feel like it resolved.   Currently feels urinary frequency, improved from last but still going on.   Has urinary odor.  Has some vaginal discharge, but may be yeast.   No burning.  No blood in urine.  No fever, no NVD, no bely or back pain.  This was her first UTI ever.  She was put on Keflex, completed the course.  Last visit she tested + for pregnancy.   She ended up having abortion.  She was put on birth control and is on week 3 of Microgestin.    No concern for STD.  Been with same boyfriend 17 years.     She is on her period currently.  LMP started today.  No other aggravating or relieving factors.  No other c/o.  No past medical history on file.  Current Outpatient Medications on File Prior to Visit  Medication Sig Dispense Refill  . MICROGESTIN 1-20 MG-MCG tablet Take 1 tablet by mouth daily.    . cephALEXin (KEFLEX) 500 MG capsule Take 1 capsule (500 mg total) by mouth 4 (four) times daily. (Patient not taking: Reported on 02/19/2020) 10 capsule 0   No current facility-administered medications on file prior to visit.   ROS as in subjective  Reviewed allergies, medications, past medical, surgical, and social history.     Objective: BP 130/80   Pulse 70   Temp 98.7 F (37.1 C)   Ht 5\' 5"  (1.651 m)   Wt 172 lb 3.2 oz (78.1 kg)   LMP 11/30/2019   SpO2 99%   BMI 28.66 kg/m   General appearance: alert, no distress, WD/WN, female Abdomen: +bs, soft, non tender, non distended, no masses, no hepatomegaly, no splenomegaly, no bruits Back: no CVA tenderness GU: deferred      Assessment: Encounter Diagnoses  Name Primary?  . Urinary frequency Yes  .  Vaginal discharge   . Abortion history      Plan: We discussed her symptoms and concerns.  I reviewed her June 14 visit notes.  She had a new pregnancy finding that day.  She has since had an abortion and has subsequently started birth control  I suspect her current symptoms are most likely yeast vaginitis related to being on antibiotic recently for UTI.  Self wet prep swabs were done today  Yeast +  Begin Diflucan as below.  She notes compliance of birth control.  Jody Brady was seen today for urinary tract infection.  Diagnoses and all orders for this visit:  Urinary frequency -     POCT Wet Prep [facility])  Vaginal discharge -     POCT Wet Prep [facility])  Abortion history  Other orders -     fluconazole (DIFLUCAN) 150 MG tablet; 1 tablet now, may repeat in 1 week     Follow-up as needed

## 2020-07-17 ENCOUNTER — Encounter (HOSPITAL_COMMUNITY): Payer: Self-pay | Admitting: Emergency Medicine

## 2020-07-17 ENCOUNTER — Emergency Department (HOSPITAL_COMMUNITY)
Admission: EM | Admit: 2020-07-17 | Discharge: 2020-07-17 | Disposition: A | Discharge status: Home / Self Care | Payer: 59 | Attending: Emergency Medicine | Admitting: Emergency Medicine

## 2020-07-17 ENCOUNTER — Emergency Department (HOSPITAL_COMMUNITY): Payer: 59

## 2020-07-17 ENCOUNTER — Other Ambulatory Visit: Payer: Self-pay

## 2020-07-17 DIAGNOSIS — Y92410 Unspecified street and highway as the place of occurrence of the external cause: Secondary | ICD-10-CM | POA: Diagnosis not present

## 2020-07-17 DIAGNOSIS — Z87891 Personal history of nicotine dependence: Secondary | ICD-10-CM | POA: Diagnosis not present

## 2020-07-17 DIAGNOSIS — R6884 Jaw pain: Secondary | ICD-10-CM | POA: Diagnosis not present

## 2020-07-17 DIAGNOSIS — R519 Headache, unspecified: Secondary | ICD-10-CM | POA: Diagnosis present

## 2020-07-17 MED ORDER — ACETAMINOPHEN 500 MG PO TABS
1000.0000 mg | ORAL_TABLET | Freq: Once | ORAL | Status: AC
  Administered 2020-07-17: 1000 mg via ORAL
  Filled 2020-07-17: qty 2
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
## 2020-07-17 NOTE — ED Notes (Signed)
Pt is in xray

## 2020-07-17 NOTE — Discharge Instructions (Signed)
Thank you for choosing [facility] for your care today.  To do: You  will probably feel a bit more sore tomorrow than you do today due to stiff muscles.  You can continue to treat pain with alternating Tylenol and Motrin/Advil every 3 hours.  You can ice any sore joints for additional comfort. Please come back to the ER if you notice worsening headache, confusion or other mental status change, difficulty staying awake, or if he starts to have any worsening pain in specific joints.  Please also come back if you notice new numbness, weakness, or tingling as is may be a sign of a spine or nerve injury. Please follow up with your primary care doctor in the next few days. If you do not have a PCP you are established with, you can call [phone]  or [phone]  to access [facility] Find A Doctor service. You can also visit [email or web address] Please come back to the Emergency Department if you have shortness of breath, chest pain, confusion/mental status changes, if you have so much nausea/vomiting that you cannot keep down fluids, or if you have any other symptoms that worry you.  Take care. Hope you start feeling better soon.  Corliss Blacker, MD Emergency Medicine
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
## 2020-07-21 ENCOUNTER — Other Ambulatory Visit: Payer: Self-pay

## 2020-07-21 ENCOUNTER — Ambulatory Visit (INDEPENDENT_AMBULATORY_CARE_PROVIDER_SITE_OTHER): Payer: 59 | Admitting: Family Medicine

## 2020-07-21 ENCOUNTER — Encounter: Payer: Self-pay | Admitting: Family Medicine

## 2020-07-21 DIAGNOSIS — M791 Myalgia, unspecified site: Secondary | ICD-10-CM

## 2020-07-21 DIAGNOSIS — R519 Headache, unspecified: Secondary | ICD-10-CM

## 2020-07-21 DIAGNOSIS — M79622 Pain in left upper arm: Secondary | ICD-10-CM | POA: Diagnosis not present

## 2020-07-21 DIAGNOSIS — M549 Dorsalgia, unspecified: Secondary | ICD-10-CM

## 2020-07-21 MED ORDER — METHOCARBAMOL 500 MG PO TABS: 500.0000 mg | ORAL_TABLET | Freq: Three times a day (TID) | ORAL | 0 refills | Status: DC | PRN

## 2020-07-21 MED ORDER — IBUPROFEN 600 MG PO TABS: 600.0000 mg | ORAL_TABLET | Freq: Three times a day (TID) | ORAL | 0 refills | Status: DC | PRN

## 2020-07-21 NOTE — Patient Instructions (Signed)
Use heat on the areas that are bothering you.  You may want to get a heating pad.  You can also get a topical pain medication with lidocaine in it or menthol such as Biofreeze or Salonpas.  Take the ibuprofen with food as prescribed for the next few days.  You can take the muscle relaxant as needed for moderate or severe pain but be aware it may be sedating.  Follow-up if you are getting worse or if you are not close to baseline in 2 weeks.
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
## 2020-07-29 ENCOUNTER — Ambulatory Visit: Payer: 59 | Admitting: Family Medicine

## 2020-07-29 ENCOUNTER — Other Ambulatory Visit: Payer: Self-pay

## 2020-07-29 ENCOUNTER — Encounter: Payer: Self-pay | Admitting: Family Medicine

## 2020-07-29 VITALS — BP 140/90 | HR 75 | Temp 98.1°F | Wt 180.0 lb

## 2020-07-29 DIAGNOSIS — M79602 Pain in left arm: Secondary | ICD-10-CM
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
## 2020-08-04 ENCOUNTER — Encounter: Payer: Self-pay | Admitting: Family Medicine

## 2020-08-04 ENCOUNTER — Ambulatory Visit (INDEPENDENT_AMBULATORY_CARE_PROVIDER_SITE_OTHER): Payer: 59 | Admitting: Family Medicine

## 2020-08-04 ENCOUNTER — Other Ambulatory Visit: Payer: Self-pay

## 2020-08-04 DIAGNOSIS — M79602 Pain in left arm: Secondary | ICD-10-CM | POA: Diagnosis not present

## 2020-08-04 DIAGNOSIS — R202 Paresthesia of skin: Secondary | ICD-10-CM

## 2020-08-04 MED ORDER — GABAPENTIN 100 MG PO CAPS: ORAL_CAPSULE | ORAL | 3 refills | Status: DC
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
## 2020-08-12 ENCOUNTER — Ambulatory Visit: Payer: 59 | Attending: Family Medicine

## 2020-08-12 ENCOUNTER — Other Ambulatory Visit: Payer: Self-pay

## 2020-08-12 DIAGNOSIS — M79602 Pain in left arm: Secondary | ICD-10-CM | POA: Insufficient documentation
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
## 2020-08-12 NOTE — Patient Instructions (Signed)
Wall slide /reaching behind back and neck for ROM 1-3 reps 2x/day  5-10 sec hold

## 2020-08-18 ENCOUNTER — Ambulatory Visit: Payer: 59

## 2020-08-18 ENCOUNTER — Other Ambulatory Visit: Payer: Self-pay

## 2020-08-18 DIAGNOSIS — M79602 Pain in left arm: Secondary | ICD-10-CM

## 2020-08-18 NOTE — Therapy (Signed)
[facility] Outpatient Rehabilitation [facility] [address] [place], [place], [address] Phone: [phone]   Fax:  [phone]  Physical Therapy Treatment  Patient Details  Name: Jody Brady MRN: [id number] Date of Birth: Nov 19, 1985 Referring Provider (PT): Lavada Mesi, MD   Encounter Date: 08/18/2020   PT End of Session - 08/18/20 1333    Visit Number 2    Number of Visits 10    Date for PT Re-Evaluation 09/25/20    Authorization Type [employer] 10 visits    Authorization - Visit Number 2    Authorization - Number of Visits 10    PT Start Time 1230    PT Stop Time 1325    PT Time Calculation (min) 55 min    Activity Tolerance Patient tolerated treatment well    Behavior During Therapy [facility] for tasks assessed/performed           History reviewed. No pertinent past medical history.  History reviewed. No pertinent surgical history.  There were no vitals filed for this visit.   Subjective Assessment - 08/18/20 1232    Subjective Patient reports the shoulder is feeling about the same since initial evaluation. She has been completing exercises, but still feels like the Lt arm is just "dead weight" and "heavy"    Currently in Pain? Yes    Pain Score 7     Pain Location Arm    Pain Orientation Left;Anterior;Posterior;Proximal    Pain Descriptors / Indicators Throbbing    Pain Type Acute pain    Pain Onset 1 to 4 weeks ago    Pain Frequency Intermittent              [facility] PT Assessment - 08/18/20 0001      AROM   Overall AROM Comments Lt shoulder AROM WFL, though visible shaking with AROM and reports of pain with all ROM activity      Palpation   Spinal mobility C4-5 CPAs hypermobility with pain, C7-T4 CPAs hypomobility with pain                         [facility] Adult PT Treatment/Exercise - 08/18/20 0001      Self-Care   Self-Care Other Self-Care Comments    Other Self-Care Comments  see patient education      Neck Exercises:  Supine   Neck Retraction Limitations 1 x 10; 5 sec hold    Other Supine Exercise ulnar and median nerve glides 1 x 5 each      Shoulder Exercises: Seated   Retraction Limitations 1 x 10      Shoulder Exercises: Isometric Strengthening   Flexion Limitations 1 x 5    Extension Limitations 1 x 5    External Rotation Limitations 1 x 5    Internal Rotation Limitations 1x5    ABduction Limitations 1x5      Shoulder Exercises: Stretch   Cross Chest Stretch Limitations 30 sec LUE    Other Shoulder Stretches rhomboid stretch attempted pain      Modalities   Modalities Moist Heat      Moist Heat Therapy   Number Minutes Moist Heat 10 Minutes    Moist Heat Location Cervical      Manual Therapy   Manual therapy comments Grade II-III C7-T4 CPAs, STM to Lt trapezius, levator scapulae, rhomboid musculature                  PT Education - 08/18/20 1338  Education Details Updated HEP. Modalities for pain control    Person(s) Educated Patient    Methods Explanation;Demonstration;Handout    Comprehension Verbalized understanding;Returned demonstration            PT Short Term Goals - 08/12/20 0921      PT SHORT TERM GOAL #1   Title She will be independent with initial HEP    Time 2    Period Weeks    Status New             PT Long Term Goals - 08/12/20 0539      PT LONG TERM GOAL #1   Title She will be independent with all HEP issued    Time 5    Period Weeks    Status New      PT LONG TERM GOAL #2   Title She will report pain decreased 50% or more    Time 5    Period Weeks    Status New      PT LONG TERM GOAL #3   Title She will have full use of  LT arm for self care and moderate level home tasks    Time 5    Period Weeks    Status New      PT LONG TERM GOAL #4   Title FOTO score will improve  to  78%    Time 5    Period Weeks      PT LONG TERM GOAL #5   Title She will return to normal lifting at work.    Time 5    Period Weeks    Status New                  Plan - 08/18/20 1324    Clinical Impression Statement Patient demonstrates full Lt shoulder AROM, though reports pain and visible shaking in musuclature when performing AROM. She is noted to have hypomobility about upper T-spine with referred pain into Lt scapula/posterior shoulder with mild reduction in pain following joint mobilizations. Introduction to shoulder isometrics with patient quickly fatiguing, though no reports of increased pain. Patient reported initial increase in pain when performing cervical retraction and scapular retraction, though when instructed to decrease ROM patient reported a reduction in pain.    Personal Factors and Comorbidities Time since onset of injury/illness/exacerbation    Examination-Activity Limitations Lift;Reach Overhead    Examination-Participation Restrictions Occupation    Stability/Clinical Decision Making Evolving/Moderate complexity    Rehab Potential Good    PT Frequency 2x / week    PT Duration --   5 weeks   PT Treatment/Interventions Electrical Stimulation;Iontophoresis 4mg /ml Dexamethasone;Moist Heat;Therapeutic exercise;Therapeutic activities;Manual techniques;Patient/family education;Passive range of motion;Dry needling;Taping    PT Next Visit Plan review HEP. progress shoulder isometrics, periscapular strength as tolerated. Upper T-spine mobility, cervical stability.    PT Home Exercise Plan Access Code:    Consulted and Agree with Plan of Care Patient           Patient will benefit from skilled therapeutic intervention in order to improve the following deficits and impairments:  Impaired UE functional use,Pain,Decreased activity tolerance  Visit Diagnosis: Pain in left arm     Problem List Patient Active Problem List   Diagnosis Date Noted   Urinary frequency 02/19/2020   Vaginal discharge 02/19/2020   Abortion history 02/19/2020   02/21/2020, PT, DPT, ATC 08/18/20 1:44 PM [facility] [address] [place], [place], [place] Phone:  [phone]   Fax:  [phone]  Name: Jody Brady MRN: [id number] Date of Birth: 09-Jun-1986

## 2020-08-24 ENCOUNTER — Ambulatory Visit: Payer: 59

## 2020-08-29 ENCOUNTER — Ambulatory Visit: Payer: 59 | Admitting: Rehabilitative and Restorative Service Providers"

## 2020-09-01 ENCOUNTER — Telehealth: Payer: Self-pay | Admitting: Family Medicine

## 2020-09-01 ENCOUNTER — Ambulatory Visit: Payer: 59 | Attending: Family Medicine

## 2020-09-01 ENCOUNTER — Other Ambulatory Visit: Payer: Self-pay

## 2020-09-01 DIAGNOSIS — M79602 Pain in left arm: Secondary | ICD-10-CM | POA: Diagnosis not present

## 2020-09-01 NOTE — Therapy (Signed)
[facility] Outpatient Rehabilitation [facility] [address] [place], [place], [address] Phone: [phone]   Fax:  [phone]  Physical Therapy Treatment  Patient Details  Name: Jody Brady MRN: [id number] Date of Birth: 1986-01-29 Referring Provider (PT): Lavada Mesi, MD   Encounter Date: 09/01/2020   PT End of Session - 09/01/20 1416    Visit Number 3    Number of Visits 10    Date for PT Re-Evaluation 09/25/20    Authorization Type [employer] 10 visits    Authorization - Visit Number 3    Authorization - Number of Visits 10    PT Start Time 1330    PT Stop Time 1413    PT Time Calculation (min) 43 min    Activity Tolerance Patient tolerated treatment well    Behavior During Therapy [facility] for tasks assessed/performed           History reviewed. No pertinent past medical history.  History reviewed. No pertinent surgical history.  There were no vitals filed for this visit.   Subjective Assessment - 09/01/20 1333    Subjective "My shoulder feels the same. I have been doing all the exercises and the only one that hurts is using the tennis ball.When I woke up this morning my pain was 10/10 with tingling to my fingers." Patient reports her work restrictions end on 09/04/20 and is unsure if she should follow-up with physician regarding work specific activity.    Currently in Pain? Yes    Pain Score 7     Pain Location Arm    Pain Orientation Left    Pain Descriptors / Indicators Aching    Pain Type Acute pain    Pain Onset More than a month ago              [facility] PT Assessment - 09/01/20 0001      Special Tests   Other special tests (+) Spurlings                         [facility] Adult PT Treatment/Exercise - 09/01/20 0001      Self-Care   Other Self-Care Comments  see patient education      Neck Exercises: Seated   Neck Retraction Limitations repeated retraction 1 x 10; repeated retraction with extension 2 x 10      Neck Exercises:  Supine   Neck Retraction Limitations repeated retraction 2 x 10; repeated retraction with extension 1 x 10      Shoulder Exercises: Seated   Theraband Level (Shoulder External Rotation) Level 2 (Red)    External Rotation Limitations bilateral shoulder ER      Shoulder Exercises: Standing   Theraband Level (Shoulder Extension) Level 2 (Red)    Extension Limitations 2 x 10                  PT Education - 09/01/20 1415    Education Details Education on centralization phenomenon and updated HEP. Recommendation to f/u with physician regarding work specific limitations.    Person(s) Educated Patient    Methods Explanation;Demonstration;Handout    Comprehension Verbalized understanding;Returned demonstration            PT Short Term Goals - 09/01/20 1418      PT SHORT TERM GOAL #1   Title She will be independent with initial HEP    Time 2    Period Weeks    Status Achieved  PT Long Term Goals - 08/12/20 7510      PT LONG TERM GOAL #1   Title She will be independent with all HEP issued    Time 5    Period Weeks    Status New      PT LONG TERM GOAL #2   Title She will report pain decreased 50% or more    Time 5    Period Weeks    Status New      PT LONG TERM GOAL #3   Title She will have full use of  LT arm for self care and moderate level home tasks    Time 5    Period Weeks    Status New      PT LONG TERM GOAL #4   Title FOTO score will improve  to  78%    Time 5    Period Weeks      PT LONG TERM GOAL #5   Title She will return to normal lifting at work.    Time 5    Period Weeks    Status New                 Plan - 09/01/20 1357    Clinical Impression Statement Patient's LUE pain appears radicular in nature as patient reports numbness along ulnar nerve distribution and positive Spurling's test upon assessment today. She demonstrates initial positive response to repeated cervical retraction with extension decreasing and  centralizing her pain from the Lt upper arm to the Lt scapula rated as 5/10. Overall good tolerance to progression of postural correctives without increased pain reported.    Personal Factors and Comorbidities Time since onset of injury/illness/exacerbation    Examination-Activity Limitations Lift;Reach Overhead    Examination-Participation Restrictions Occupation    Stability/Clinical Decision Making Evolving/Moderate complexity    Rehab Potential Good    PT Frequency 2x / week    PT Duration --   5 weeks   PT Treatment/Interventions Electrical Stimulation;Iontophoresis 4mg /ml Dexamethasone;Moist Heat;Therapeutic exercise;Therapeutic activities;Manual techniques;Patient/family education;Passive range of motion;Dry needling;Taping    PT Next Visit Plan assess response to repeated movement, consider traction. periscapular strength    PT Home Exercise Plan Access Code:    Recommended Other Services f/u with physician regarding work specific limitations.    Consulted and Agree with Plan of Care Patient           Patient will benefit from skilled therapeutic intervention in order to improve the following deficits and impairments:  Impaired UE functional use,Pain,Decreased activity tolerance  Visit Diagnosis: Pain in left arm     Problem List Patient Active Problem List   Diagnosis Date Noted  . Urinary frequency 02/19/2020  . Vaginal discharge 02/19/2020  . Abortion history 02/19/2020   02/21/2020, PT, DPT, ATC 09/01/20 2:19 PM  [facility] Out[facility] [address] [place], [place], [place] Phone: [phone]   Fax:  [phone]  Name: CHARMAGNE BUHL MRN: Leandrew Koyanagi Date of Birth: 1985/12/20

## 2020-09-01 NOTE — Telephone Encounter (Signed)
Note written and placed in an envelope for patient pickup, per request.

## 2020-09-01 NOTE — Telephone Encounter (Signed)
Yes, that's fine 

## 2020-09-01 NOTE — Telephone Encounter (Signed)
Ok

## 2020-09-01 NOTE — Telephone Encounter (Signed)
Pt called was wondering if she can get a light duty work note extended until she is done with therapy? CB [phone]

## 2020-09-03 ENCOUNTER — Other Ambulatory Visit: Payer: Self-pay

## 2020-09-03 ENCOUNTER — Ambulatory Visit: Payer: 59

## 2020-09-03 DIAGNOSIS — M79602 Pain in left arm: Secondary | ICD-10-CM | POA: Diagnosis not present

## 2020-09-03 NOTE — Therapy (Signed)
[facility] [address] [place], [place], [address] Phone: [phone]   Fax:  [phone]  Physical Therapy Treatment  Patient Details  Name: Jody Brady MRN: [id number] Date of Birth: 03-12-1986 Referring Provider (PT): Lavada Mesi, MD   Encounter Date: 09/03/2020   PT End of Session - 09/03/20 1548    Visit Number 4    Number of Visits 10    Date for PT Re-Evaluation 09/25/20    Authorization Type [employer] 10 visits    Authorization - Visit Number 4    Authorization - Number of Visits 10    PT Start Time 1548    PT Stop Time 1643    PT Time Calculation (min) 55 min    Activity Tolerance Patient tolerated treatment well    Behavior During Therapy [facility] for tasks assessed/performed           History reviewed. No pertinent past medical history.  History reviewed. No pertinent surgical history.  There were no vitals filed for this visit.   Subjective Assessment - 09/03/20 1550    Subjective "I am still hurting." She reports consistency with HEP and repeated movement, but didn't notice any improvements in her pain. She reached out to physician and is on light duty at work until the end of the month.    Currently in Pain? Yes    Pain Score 6     Pain Location Arm    Pain Orientation Left    Pain Descriptors / Indicators Aching    Pain Type Acute pain    Pain Onset More than a month ago              [facility] PT Assessment - 09/03/20 0001      AROM   Overall AROM Comments Cervical AROM WNL with patient reporting tightness during Rt Sidebend                         [facility] Adult PT Treatment/Exercise - 09/03/20 0001      Self-Care   Other Self-Care Comments  see patient education      Neck Exercises: Seated   Neck Retraction Limitations repeated retraction with extension 1 x 10 (no effect)    Other Seated Exercise repeated thoracic extension 1 x 10 (no effect)    Other Seated Exercise upper trap  stretch 30 sec each      Neck Exercises: Supine   Neck Retraction Limitations repeated retraction (no effect)      Shoulder Exercises: Prone   Retraction Limitations 2 x 10      Moist Heat Therapy   Number Minutes Moist Heat 10 Minutes    Moist Heat Location Cervical      Manual Therapy   Manual therapy comments passive upper trap stretch    Joint Mobilization Grade II-III C7-T5 CPAs and Lt UPAs    Soft tissue mobilization Lt upper trap, levator scapulae, rhomboids (trigger point release to Lt upper trap    Manual Traction cervical distraction (no effect on pain)    Kinesiotex Inhibit Muscle      Kinesiotix   Inhibit Muscle  Lt upper trap                  PT Education - 09/03/20 1639    Education Details education on posture;education on K-tape proper removal and to remove if she has signs of skin irritation; education on TPDN indications, expectations, side effects;education on modalities  for pain control    Person(s) Educated Patient    Methods Explanation;Demonstration;Handout    Comprehension Verbalized understanding            PT Short Term Goals - 09/01/20 1418      PT SHORT TERM GOAL #1   Title She will be independent with initial HEP    Time 2    Period Weeks    Status Achieved             PT Long Term Goals - 08/12/20 5465      PT LONG TERM GOAL #1   Title She will be independent with all HEP issued    Time 5    Period Weeks    Status New      PT LONG TERM GOAL #2   Title She will report pain decreased 50% or more    Time 5    Period Weeks    Status New      PT LONG TERM GOAL #3   Title She will have full use of  LT arm for self care and moderate level home tasks    Time 5    Period Weeks    Status New      PT LONG TERM GOAL #4   Title FOTO score will improve  to  78%    Time 5    Period Weeks      PT LONG TERM GOAL #5   Title She will return to normal lifting at work.    Time 5    Period Weeks    Status New                  Plan - 09/03/20 1634    Clinical Impression Statement Patient reported a slight reduction in pain at end of session rated as 5/10 though reports pain is more centralized to the superior aspect of her Lt shoulder as opposed to diffuse ache about entire shoulder upon arrival. While patient demonstrated initial postive response to extension biased exercises at last session, this does not appear to create centralization of her pain as patient reports consistency with completing repeated cervical retraction with extension since last visit without notable change in her pain. She is found to have upper T-spine hypomobility as well as tautness/trigger points and palpable tenderness about Lt periscapular musculature. She has fair tolerance to manual therapy techniques (soft tissue mobilization/trigger point release) and might potentially tolerate TPDN better at future sessions to assist in reducing musculature restrictions.    Personal Factors and Comorbidities Time since onset of injury/illness/exacerbation    Examination-Activity Limitations Lift;Reach Overhead    Examination-Participation Restrictions Occupation    Stability/Clinical Decision Making Evolving/Moderate complexity    Rehab Potential Good    PT Frequency 2x / week    PT Duration --   5 weeks   PT Treatment/Interventions Electrical Stimulation;Iontophoresis 4mg /ml Dexamethasone;Moist Heat;Therapeutic exercise;Therapeutic activities;Manual techniques;Patient/family education;Passive range of motion;Dry needling;Taping    PT Next Visit Plan assess response to K-tape. Consider TPDN, manual techniques as tolerated to improve T-spine mobility and reduce musculature restrictions.    PT Home Exercise Plan Access Code:    Consulted and Agree with Plan of Care Patient           Patient will benefit from skilled therapeutic intervention in order to improve the following deficits and impairments:  Impaired UE functional  use,Pain,Decreased activity tolerance  Visit Diagnosis: Pain in left arm     Problem List Patient  Active Problem List   Diagnosis Date Noted  . Urinary frequency 02/19/2020  . Vaginal discharge 02/19/2020  . Abortion history 02/19/2020   Letitia Libra, PT, DPT, ATC 09/03/20 4:50 PM [facility] [address] [place], [place], [address] Phone: [phone]   Fax:  [phone]  Name: Jody Brady MRN: [id number] Date of Birth: 1986/04/29

## 2020-09-08 ENCOUNTER — Ambulatory Visit: Payer: 59

## 2020-09-08 ENCOUNTER — Other Ambulatory Visit: Payer: Self-pay

## 2020-09-08 DIAGNOSIS — M79602 Pain in left arm: Secondary | ICD-10-CM

## 2020-09-08 NOTE — Therapy (Signed)
[facility] Outpatient [facility] [address] [place], [place], [address] Phone: [phone]   Fax:  [phone]  Physical Therapy Treatment  Patient Details  Name: Jody Brady MRN: [id number] Date of Birth: Jan 13, 1986 Referring Provider (PT): Lavada Mesi, MD   Encounter Date: 09/08/2020   PT End of Session - 09/08/20 1624    Visit Number 5    Number of Visits 10    Date for PT Re-Evaluation 09/25/20    Authorization Type [employer] 10 visits    Authorization - Visit Number 5    Authorization - Number of Visits 10    PT Start Time 1551   patient late   PT Stop Time 1640    PT Time Calculation (min) 49 min    Activity Tolerance Patient tolerated treatment well    Behavior During Therapy [facility] for tasks assessed/performed           History reviewed. No pertinent past medical history.  History reviewed. No pertinent surgical history.  There were no vitals filed for this visit.   Subjective Assessment - 09/08/20 1552    Subjective "I'm feeling fine." She reports not having as much of the ache/tightness about the lateral aspect of the shoulder since last session, but is still having pain along the Lt shoulder blade "it just hurts, I can't really describe it."    Currently in Pain? Yes    Pain Score 4     Pain Location Scapula    Pain Orientation Left;Upper    Pain Descriptors / Indicators Aching    Pain Type Acute pain    Pain Onset More than a month ago                             [facility] Adult PT Treatment/Exercise - 09/08/20 0001      Self-Care   Self-Care Posture;Heat/Ice Application    Posture seated posture    Heat/Ice Application use of heat as needed for pain control    Other Self-Care Comments  see patient education      Neck Exercises: Seated   Other Seated Exercise cervical extension SNAG 1 x 10      Neck Exercises: Supine   Other Supine Exercise cat/camel x 20 reps quadruped      Shoulder Exercises: Prone    Retraction Limitations 2 x 10      Modalities   Modalities Moist Heat      Moist Heat Therapy   Number Minutes Moist Heat 10 Minutes    Moist Heat Location Cervical      Manual Therapy   Manual therapy comments passive upper trap stretch    Joint Mobilization Grade II-III C7-T5 CPAs and Lt UPAs    Soft tissue mobilization Lt trapezius, levator scapulae, rhomboids (trigger point release to Lt upper trap            Trigger Point Dry Needling - 09/08/20 0001    Consent Given? Yes    Education Handout Provided Yes    Muscles Treated Head and Neck Upper trapezius    Dry Needling Comments performed by Frederik Schmidt PT, DPT    Upper Trapezius Response Twitch reponse elicited;Palpable increased muscle length                PT Education - 09/08/20 1636    Education Details review of TPDN indications, side effects, expectations. Verbal consent obtained for TPDN.    Person(s) Educated Patient  Methods Explanation    Comprehension Verbalized understanding            PT Short Term Goals - 09/01/20 1418      PT SHORT TERM GOAL #1   Title She will be independent with initial HEP    Time 2    Period Weeks    Status Achieved             PT Long Term Goals - 08/12/20 5277      PT LONG TERM GOAL #1   Title She will be independent with all HEP issued    Time 5    Period Weeks    Status New      PT LONG TERM GOAL #2   Title She will report pain decreased 50% or more    Time 5    Period Weeks    Status New      PT LONG TERM GOAL #3   Title She will have full use of  LT arm for self care and moderate level home tasks    Time 5    Period Weeks    Status New      PT LONG TERM GOAL #4   Title FOTO score will improve  to  78%    Time 5    Period Weeks      PT LONG TERM GOAL #5   Title She will return to normal lifting at work.    Time 5    Period Weeks    Status New                 Plan - 09/08/20 1625    Clinical Impression Statement  Patient reports that her pain has mostly localized to the Lt scapula with minimal pain along lateral shoulder since last visit. She continues to have tautness and palpable tenderness about the Lt trapezius and levator scapulae. Patient tolerated TPDN well to the Lt upper trap with excellent twitch response elicited reporting soreness post intervention as expected. She will potentially benefit from continued TPDN at future sessions if tautness remains. Hypomobility remains about upper T-spine requiring continued emphasis on improving mobility with potential implementation of TPDN to multifidi to assist in improving T-spine mobility.    Personal Factors and Comorbidities Time since onset of injury/illness/exacerbation    Examination-Activity Limitations Lift;Reach Overhead    Examination-Participation Restrictions Occupation    Stability/Clinical Decision Making Evolving/Moderate complexity    Rehab Potential Good    PT Frequency 2x / week    PT Duration --   5 weeks   PT Treatment/Interventions Electrical Stimulation;Iontophoresis 4mg /ml Dexamethasone;Moist Heat;Therapeutic exercise;Therapeutic activities;Manual techniques;Patient/family education;Passive range of motion;Dry needling;Taping    PT Next Visit Plan Consider TPDN, manual techniques as tolerated to improve T-spine mobility and reduce musculature restrictions.    PT Home Exercise Plan Access Code:    Consulted and Agree with Plan of Care Patient           Patient will benefit from skilled therapeutic intervention in order to improve the following deficits and impairments:  Impaired UE functional use,Pain,Decreased activity tolerance  Visit Diagnosis: Pain in left arm     Problem List Patient Active Problem List   Diagnosis Date Noted  . Urinary frequency 02/19/2020  . Vaginal discharge 02/19/2020  . Abortion history 02/19/2020   02/21/2020, PT, DPT, ATC 09/08/20 4:46 PM  [facility]  [facility] [address] [place], [place], [place] Phone: [phone]   Fax:  [phone]  Name: Jody Brady MRN: [id number] Date of Birth: 1986/02/10

## 2020-09-10 ENCOUNTER — Ambulatory Visit: Payer: 59

## 2020-09-10 ENCOUNTER — Other Ambulatory Visit: Payer: Self-pay

## 2020-09-10 DIAGNOSIS — M79602 Pain in left arm: Secondary | ICD-10-CM

## 2020-09-10 NOTE — Therapy (Signed)
[place], [place], [address] Phone: [phone]   Fax:  [phone]  Physical Therapy Treatment  Patient Details  Name: Jody Brady MRN: [id number] Date of Birth: 13-Apr-1986 Referring Provider (PT): Eunice Blase, MD   Encounter Date: 09/10/2020   PT End of Session - 09/10/20 1613    Visit Number 6    Number of Visits 10    Date for PT Re-Evaluation 09/25/20    Authorization Type [employer] 10 visits    Authorization - Visit Number 6    Authorization - Number of Visits 10    PT Start Time 1950    PT Stop Time 1630    PT Time Calculation (min) 41 min    Activity Tolerance Patient tolerated treatment well    Behavior During Therapy [facility] for tasks assessed/performed           History reviewed. No pertinent past medical history.  History reviewed. No pertinent surgical history.  There were no vitals filed for this visit.   Subjective Assessment - 09/10/20 1551    Subjective Patient reports the needling might have helped as she isn't having as much pain at the top of the shoulder, but is still having pain along the lateral and posterior shoulder and shoulder blade and feeling pain described as "hitting her funny bone" from the shoulder along back of arm to fingers 4-5.    Currently in Pain? Yes    Pain Score 8     Pain Location Scapula   lateral shoulder   Pain Orientation Left;Upper    Pain Descriptors / Indicators Aching    Pain Type Acute pain    Pain Onset More than a month ago    Pain Frequency Constant              [facility] PT Assessment - 09/10/20 0001      Observation/Other Assessments   Focus on Therapeutic Outcomes (FOTO)  69%                         [facility] Adult PT Treatment/Exercise - 09/10/20 0001      Self-Care   Other Self-Care Comments  see patient education      Shoulder Exercises: Supine   Other Supine Exercises horizontal shoulder abduction red band 2 x 10       Shoulder Exercises: Seated   Theraband Level (Shoulder External Rotation) Level 2 (Red)    External Rotation Limitations bilateral shoulder ER 2 x 10      Manual Therapy   Manual therapy comments passive upper trap stretch    Joint Mobilization Grade II-III C7-T5 CPAs and Lt UPAs    Soft tissue mobilization Lt trapezius, levator scapulae, rhomboids (trigger point release to Lt upper trap and levator scapulae)                  PT Education - 09/10/20 1625    Education Details Education on recommendation to schedule f/u with physician, began pain neuroscience education, recommendation for desensitization techniques. updated HEP.    Person(s) Educated Patient    Methods Explanation;Demonstration;Handout    Comprehension Verbalized understanding;Returned demonstration            PT Short Term Goals - 09/01/20 1418      PT SHORT TERM GOAL #1   Title She will be independent with initial HEP    Time 2    Period Weeks    Status  Achieved             PT Long Term Goals - 09/10/20 1617      PT LONG TERM GOAL #1   Title She will be independent with all HEP issued    Time 5    Period Weeks    Status On-going      PT LONG TERM GOAL #2   Title She will report pain decreased 50% or more    Time 5    Period Weeks    Status On-going      PT LONG TERM GOAL #3   Title She will have full use of  LT arm for self care and moderate level home tasks    Baseline no heavy lifting at home, but does not prevent her from doing daily tasks even though it is painful    Time 5    Period Weeks    Status On-going      PT LONG TERM GOAL #4   Title FOTO score will improve  to  78%    Baseline see flowsheet    Time 5    Period Weeks    Status On-going      PT LONG TERM GOAL #5   Title She will return to normal lifting at work.    Time 5    Period Weeks    Status Not Met                 Plan - 09/10/20 1627    Clinical Impression Statement Patient continues to  have significant tautness/trigger points in Lt upper trap and levator scapulae and referred pain along scapula with palpation of trigger points. Patient declined further TPDN at this time and has fair tolerance to soft tissue moblization/trigger point release to reduce musculature restrictions. Upper T-spine mobility is gradually improving, though continues to remain limited. She reported a reduction in pain following manual therapy without increased pain reported during postural correctives. While patient reports a reduction in pain since start of care it was recommended that she schedule a follow-up with her physician while continuing with current PT POC as her pain levels continue to be moderate.    Personal Factors and Comorbidities Time since onset of injury/illness/exacerbation    Examination-Activity Limitations Lift;Reach Overhead    Examination-Participation Restrictions Occupation    Stability/Clinical Decision Making Evolving/Moderate complexity    Rehab Potential Good    PT Frequency 2x / week    PT Duration --   5 weeks   PT Treatment/Interventions Electrical Stimulation;Iontophoresis 19m/ml Dexamethasone;Moist Heat;Therapeutic exercise;Therapeutic activities;Manual techniques;Patient/family education;Passive range of motion;Dry needling;Taping    PT Next Visit Plan postural correctives, manual techniques as tolerated to improve T-spine mobility and reduce musculature restrictions.    PT Home Exercise Plan Access Code: [email or web address]   Recommended Other Services f/u with physician    Consulted and Agree with Plan of Care Patient           Patient will benefit from skilled therapeutic intervention in order to improve the following deficits and impairments:  Impaired UE functional use,Pain,Decreased activity tolerance  Visit Diagnosis: Pain in left arm     Problem List Patient Active Problem List   Diagnosis Date Noted  . Urinary frequency 02/19/2020  . Vaginal discharge  02/19/2020  . Abortion history 02/19/2020   SGwendolyn Grant PT, DPT, ATC 09/10/20 9:02 PM  [facility] [address]Phone: [phone]  Fax:  [phone]  Name: Jody Brady MRN: [id number] Date of Birth: 1986/02/10

## 2020-09-16 ENCOUNTER — Other Ambulatory Visit: Payer: Self-pay

## 2020-09-16 ENCOUNTER — Ambulatory Visit (INDEPENDENT_AMBULATORY_CARE_PROVIDER_SITE_OTHER): Payer: 59 | Admitting: Family Medicine

## 2020-09-16 ENCOUNTER — Encounter: Payer: Self-pay | Admitting: Family Medicine

## 2020-09-16 DIAGNOSIS — R202 Paresthesia of skin: Secondary | ICD-10-CM | POA: Diagnosis not present

## 2020-09-16 DIAGNOSIS — M79602 Pain in left arm: Secondary | ICD-10-CM
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
## 2020-09-17 ENCOUNTER — Telehealth: Payer: Self-pay | Admitting: Physical Medicine and Rehabilitation

## 2020-09-17 NOTE — Telephone Encounter (Signed)
Patient returned Courtney's call to set appt. Please call patient at [phone].

## 2020-09-18 ENCOUNTER — Telehealth: Payer: Self-pay | Admitting: Physical Medicine and Rehabilitation

## 2020-09-18 ENCOUNTER — Telehealth: Payer: Self-pay | Admitting: Family Medicine

## 2020-09-18 NOTE — Telephone Encounter (Signed)
Sent order to [facility] neurology

## 2020-09-18 NOTE — Telephone Encounter (Signed)
Could you try another location?

## 2020-09-18 NOTE — Telephone Encounter (Signed)
Pt called stating she is at work and will not be able to answer her phone but she states Toni Amend can pick any day and time and leave a message for her; she states she just really needs to be seen.    [phone]

## 2020-09-18 NOTE — Telephone Encounter (Signed)
Pt called stating Dr.Hilts wanted her to get a nerve test done with DR.Alvester Morin but the soonest he can get her in is 10/27/20 so she is wondering if there is someone else who can do it? Pt would like a CB to further discuss  [phone]

## 2020-09-21 ENCOUNTER — Telehealth: Payer: Self-pay | Admitting: Family Medicine

## 2020-09-21 ENCOUNTER — Encounter: Payer: Self-pay | Admitting: Family Medicine

## 2020-09-21 NOTE — Telephone Encounter (Signed)
Patient is asking for a call back from [facility] for updates of referral of new facility for nerve study. Please call patient at [phone].

## 2020-09-23 ENCOUNTER — Ambulatory Visit: Payer: 59

## 2020-09-23 NOTE — Telephone Encounter (Signed)
I called [facility] and they are out with scheduling for NCV until April, I called [facility] and left message with Burna Mortimer to return my call on what they are scheduling out til.

## 2020-09-23 NOTE — Telephone Encounter (Signed)
Pt prefers tthe March 14 week, order was faxed to [facility] at [phone]

## 2020-09-23 NOTE — Telephone Encounter (Signed)
Spoke with Burna Mortimer at [facility] they can schedule for week of March 14 ro March 21 I tried calling back to see which one she would prefer.

## 2020-09-24 ENCOUNTER — Ambulatory Visit: Payer: 59 | Admitting: Physical Therapy

## 2020-09-24 NOTE — Telephone Encounter (Signed)
Pt would like to know if she could just find her own facility in [place] and make our office aware of the location? She states even if [facility] ca get her in for the nerve study it's in [place] and that's too far for her. Pt would like a CB to let her know if this will be ok.   [phone]

## 2020-10-06 ENCOUNTER — Other Ambulatory Visit: Payer: Self-pay

## 2020-10-06 ENCOUNTER — Encounter (HOSPITAL_COMMUNITY): Payer: Self-pay

## 2020-10-06 ENCOUNTER — Ambulatory Visit (HOSPITAL_COMMUNITY): Payer: 59

## 2020-10-07 ENCOUNTER — Telehealth: Payer: Self-pay

## 2020-10-07 NOTE — Telephone Encounter (Signed)
Patient called she is requesting a sooner appointment if possible call [phone]

## 2020-10-07 NOTE — Telephone Encounter (Signed)
Called pt and LVM #1 

## 2020-10-27 ENCOUNTER — Encounter: Payer: Self-pay | Admitting: Family Medicine

## 2020-10-27 ENCOUNTER — Encounter: Payer: Self-pay | Admitting: Physical Medicine and Rehabilitation

## 2020-10-27 ENCOUNTER — Ambulatory Visit (INDEPENDENT_AMBULATORY_CARE_PROVIDER_SITE_OTHER): Payer: 59 | Admitting: Physical Medicine and Rehabilitation

## 2020-10-27 ENCOUNTER — Telehealth: Payer: Self-pay | Admitting: Family Medicine

## 2020-10-27 ENCOUNTER — Other Ambulatory Visit: Payer: Self-pay

## 2020-10-27 DIAGNOSIS — R202 Paresthesia of skin: Secondary | ICD-10-CM

## 2020-10-27 NOTE — Progress Notes (Signed)
Soreness in left arm and shoulder. Left forearm sore to touch. Left elbow pain. Tingling has stopped per patient. Right hand dominant. No lotion per patient.  Numeric Pain Rating Scale and Functional Assessment Average Pain 8   In the last MONTH (on 0-10 scale) has pain interfered with the following?  1. General activity like being  able to carry out your everyday physical activities such as walking, climbing stairs, carrying groceries, or moving a chair?  Rating(0)

## 2020-10-27 NOTE — Telephone Encounter (Signed)
Please advise on the light duty note. She saw Dr. Alvester Morin this morning. Has a f/u appt with you on 11/02/20.

## 2020-10-27 NOTE — Telephone Encounter (Signed)
Letter printed.

## 2020-10-27 NOTE — Telephone Encounter (Signed)
I called and left voice mail that both notes are ready for pickup.

## 2020-10-27 NOTE — Telephone Encounter (Signed)
Pt is up here and is wondering cans he get a work note from her visit today and also she needs another not about her on light duty. She would like to come by today to get it. I'll call her when ready if you just let me know!
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
## 2020-10-29 NOTE — Procedures (Signed)
EMG & NCV Findings: Evaluation of the left ulnar motor nerve showed decreased conduction velocity (A Elbow-B Elbow, 50 m/s).  All remaining nerves (as indicated in the following tables) were within normal limits.    All examined muscles (as indicated in the following table) showed no evidence of electrical instability.    Impression: Essentially NORMAL electrodiagnostic study of the left upper limb.  There is no significant electrodiagnostic evidence of nerve entrapment, brachial plexopathy or cervical radiculopathy.    As you know, purely sensory or demyelinating radiculopathies and chemical radiculitis may not be detected with this particular electrodiagnostic study. **Brachial plexus stretch injury could account for some of the symptoms as well and this would not show up on this particular study particular if there is been healing involved and decreased tingling.  This would have been more of a lower trunk or medial cord type issue.  Recommendations: 1.  Follow-up with referring physician. 2.  Continue current management of symptoms.  ___________________________ Naaman Plummer FAAPMR Board Certified, American Board of Physical Medicine and Rehabilitation    Nerve Conduction Studies Anti Sensory Summary Table   Stim Site NR Peak (ms) Norm Peak (ms) P-T Amp (V) Norm P-T Amp Site1 Site2 Delta-P (ms) Dist (cm) Vel (m/s) Norm Vel (m/s)  Left Med Ante Brach Cutan Anti Sensory (Med Forearm)  30.4C  Elbow    3.9  5.0  Elbow Med Forearm 3.9 14.0 36   Left Median Acr Palm Anti Sensory (2nd Digit)  29.7C  Wrist    3.1 <3.6 50.5 >10 Wrist Palm 1.3 0.0    Palm    1.8 <2.0 61.7         Left Radial Anti Sensory (Base 1st Digit)  29.7C  Wrist    2.2 <3.1 45.4  Wrist Base 1st Digit 2.2 0.0    Left Ulnar Anti Sensory (5th Digit)  30C  Wrist    3.3 <3.7 30.8 >15.0 Wrist 5th Digit 3.3 14.0 42 >38   Motor Summary Table   Stim Site NR Onset (ms) Norm Onset (ms) O-P Amp (mV) Norm O-P Amp Site1  Site2 Delta-0 (ms) Dist (cm) Vel (m/s) Norm Vel (m/s)  Left Median Motor (Abd Poll Brev)  30.7C  Wrist    3.3 <4.2 7.5 >5 Elbow Wrist 3.9 22.0 56 >50  Elbow    7.2  7.3         Left Ulnar Motor (Abd Dig Min)  30.7C  Wrist    2.7 <4.2 5.2 >3 B Elbow Wrist 3.3 21.0 64 >53  B Elbow    6.0  4.4  A Elbow B Elbow 2.0 10.0 *50 >53  A Elbow    8.0  4.4          EMG   Side Muscle Nerve Root Ins Act Fibs Psw Amp Dur Poly Recrt Int Dennie Bible Comment  Left 1stDorInt Ulnar C8-T1 Nml Nml Nml Nml Nml 0 Nml Nml   Left Abd Poll Brev Median C8-T1 Nml Nml Nml Nml Nml 0 Nml Nml   Left ExtDigCom   Nml Nml Nml Nml Nml 0 Nml Nml   Left Triceps Radial C6-7-8 Nml Nml Nml Nml Nml 0 Nml Nml   Left Deltoid Axillary C5-6 Nml Nml Nml Nml Nml 0 Nml Nml     Nerve Conduction Studies Anti Sensory Left/Right Comparison   Stim Site L Lat (ms) R Lat (ms) L-R Lat (ms) L Amp (V) R Amp (V) L-R Amp (%) Site1 Site2 L Vel (m/s) R Vel (  m/s) L-R Vel (m/s)  Med Ante Brach Cutan Anti Sensory (Med Forearm)  30.4C  Elbow 3.9   5.0   Elbow Med Forearm 36    Median Acr Palm Anti Sensory (2nd Digit)  29.7C  Wrist 3.1   50.5   Wrist Palm     Palm 1.8   61.7         Radial Anti Sensory (Base 1st Digit)  29.7C  Wrist 2.2   45.4   Wrist Base 1st Digit     Ulnar Anti Sensory (5th Digit)  30C  Wrist 3.3   30.8   Wrist 5th Digit 42     Motor Left/Right Comparison   Stim Site L Lat (ms) R Lat (ms) L-R Lat (ms) L Amp (mV) R Amp (mV) L-R Amp (%) Site1 Site2 L Vel (m/s) R Vel (m/s) L-R Vel (m/s)  Median Motor (Abd Poll Brev)  30.7C  Wrist 3.3   7.5   Elbow Wrist 56    Elbow 7.2   7.3         Ulnar Motor (Abd Dig Min)  30.7C  Wrist 2.7   5.2   B Elbow Wrist 64    B Elbow 6.0   4.4   A Elbow B Elbow *50    A Elbow 8.0   4.4            Waveforms:

## 2020-11-02 ENCOUNTER — Other Ambulatory Visit: Payer: Self-pay

## 2020-11-02 ENCOUNTER — Ambulatory Visit (INDEPENDENT_AMBULATORY_CARE_PROVIDER_SITE_OTHER): Payer: 59 | Admitting: Family Medicine

## 2020-11-02 DIAGNOSIS — M79602 Pain in left arm: Secondary | ICD-10-CM
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
## 2020-11-15 ENCOUNTER — Ambulatory Visit
Admission: RE | Admit: 2020-11-15 | Discharge: 2020-11-15 | Disposition: A | Discharge status: Home / Self Care | Payer: 59 | Source: Ambulatory Visit | Attending: Family Medicine | Admitting: Family Medicine

## 2020-11-15 ENCOUNTER — Other Ambulatory Visit: Payer: Self-pay

## 2020-11-15 DIAGNOSIS — M79602 Pain in left arm: Secondary | ICD-10-CM

## 2020-11-16 ENCOUNTER — Telehealth: Payer: Self-pay | Admitting: Family Medicine

## 2020-11-16 NOTE — Telephone Encounter (Signed)
Cervical MRI scan shows disc bulging at multiple levels but no nerve compression, nothing to explain arm numbness.  Shoulder MRI scan shows partial tearing and tendinosis of the rotator cuff but no full-thickness tear.  No clear-cut indication for surgery based on these results.  Could consider referral back to Dr. Alvester Morin for possible cortisone injection.  Or another option would be to see a chiropractor.

## 2020-12-10 ENCOUNTER — Encounter: Payer: Self-pay | Admitting: Family Medicine

## 2020-12-15 ENCOUNTER — Telehealth: Payer: Self-pay | Admitting: Family Medicine

## 2020-12-15 NOTE — Telephone Encounter (Signed)
Pt would like to know if you can give her a new work note that states she can not lift over 20 pounds. She will come pick up when ready and would like a call when ready.  CB [phone]

## 2020-12-15 NOTE — Telephone Encounter (Signed)
Yes, Ok

## 2020-12-15 NOTE — Telephone Encounter (Signed)
Ok

## 2020-12-15 NOTE — Telephone Encounter (Signed)
I called and advised the patient the note is ready for pickup.

## 2021-01-11 ENCOUNTER — Encounter: Payer: Self-pay | Admitting: Family Medicine

## 2021-03-24 ENCOUNTER — Other Ambulatory Visit: Payer: Self-pay

## 2021-03-24 ENCOUNTER — Ambulatory Visit: Payer: PRIVATE HEALTH INSURANCE | Admitting: Medical

## 2021-03-24 VITALS — BP 126/80 | HR 64 | Wt 184.4 lb

## 2021-03-24 DIAGNOSIS — N61 Mastitis without abscess: Secondary | ICD-10-CM

## 2021-03-24 DIAGNOSIS — N644 Mastodynia: Secondary | ICD-10-CM | POA: Diagnosis not present

## 2021-03-24 DIAGNOSIS — S21039A Puncture wound without foreign body of unspecified breast, initial encounter: Secondary | ICD-10-CM

## 2021-03-24 MED ORDER — AMOXICILLIN-POT CLAVULANATE 875-125 MG PO TABS: 1.0000 | ORAL_TABLET | Freq: Two times a day (BID) | ORAL | 0 refills | Status: DC

## 2021-03-24 NOTE — Progress Notes (Signed)
Subjective:  Jody Brady is a 35 y.o. female who presents for Chief Complaint  Patient presents with   Breast Problem    Breast issues. Has nipple piercing but having some discharge and pain from it. Took left ring out as it was bleeding. Wants to make sure they are not infected. Had piercing over 2 years     Here for concern for of possible skin infection of breast.  She has pierced nipples.  Recently she was having tenderness and drainage.  She took the left one out.  She still has the right piercing in place.  Her tenderness has improved since last week but she still has some tenderness.  She thought they may have been infected last week when they were worse tender and had some drainage including some bloody drainage.  She denies fever, body aches or chills  She also has a piercing of her umbilicus.  No concerns with that.  No other aggravating or relieving factors.    No other c/o.  The following portions of the patient's history were reviewed and updated as appropriate: allergies, current medications, past family history, past medical history, past social history, past surgical history and problem list.  ROS Otherwise as in subjective above    Objective: BP 126/80   Pulse 64   Wt 184 lb 6.4 oz (83.6 kg)   BMI 30.69 kg/m   General appearance: alert, no distress, well developed, well nourished, African-American female There is a piercing in place of the right nipple.  Bilat breasts with no obvious tenderness, no obvious swelling, no obvious drainage, no mass no lymphadenopathy.  Exam chaperoned by nurse    Assessment: Encounter Diagnoses  Name Primary?   Pierced nipple infection Yes   Breast tenderness      Plan: Her symptoms suggest that she likely did have an infection last week that probably healed on its own.  We discussed hygiene, soap and water cleaning, we discussed cleaning the piercing periodically about taking it out and clean with alcohol.  We discussed  general breast cancer screening, self breast exams.  We discussed signs to suggest infection such as redness, swelling, worse pain, drainage or odor.  I prescribed Augmentin in the event there is any signs of infection in the short term since she still has tenderness today  Omaya was seen today for breast problem.  Diagnoses and all orders for this visit:  Pierced nipple infection  Breast tenderness  Other orders -     amoxicillin-clavulanate (AUGMENTIN) 875-125 MG tablet; Take 1 tablet by mouth 2 (two) times daily.   Follow up: soon for physical fasting

## 2021-04-14 ENCOUNTER — Ambulatory Visit: Payer: PRIVATE HEALTH INSURANCE | Admitting: Family Medicine

## 2021-04-14 ENCOUNTER — Other Ambulatory Visit: Payer: Self-pay

## 2021-04-14 ENCOUNTER — Encounter: Payer: Self-pay | Admitting: Family Medicine

## 2021-04-14 VITALS — BP 124/82 | HR 72 | Temp 98.6°F | Ht 64.0 in | Wt 186.0 lb

## 2021-04-14 DIAGNOSIS — Z2821 Immunization not carried out because of patient refusal: Secondary | ICD-10-CM

## 2021-04-14 DIAGNOSIS — E669 Obesity, unspecified: Secondary | ICD-10-CM

## 2021-04-14 DIAGNOSIS — Z0000 Encounter for general adult medical examination without abnormal findings: Secondary | ICD-10-CM | POA: Diagnosis not present

## 2021-04-14 DIAGNOSIS — Z30011 Encounter for initial prescription of contraceptive pills: Secondary | ICD-10-CM | POA: Diagnosis not present

## 2021-04-14 LAB — POCT URINE PREGNANCY: Preg Test, Ur: NEGATIVE

## 2021-04-14 MED ORDER — NORETHINDRONE ACET-ETHINYL EST 1-20 MG-MCG PO TABS: 1.0000 | ORAL_TABLET | Freq: Every day | ORAL | 11 refills | Status: DC

## 2021-04-14 NOTE — Progress Notes (Signed)
Subjective:    Patient ID: Jody Brady, female    DOB: [birth year], 35 y.o.   MRN: [id number]  HPI Chief Complaint  Patient presents with   Annual Exam    Cpe no obgyn, did want to talk about getting back on [facility]. PAP breast and pelvic as well. Pt. Refused flu shot   She is here for a complete physical exam.  She requests to start back on OCPs. Last took them in January 2022. Denies hx of DVT or PE. No family hx of bleeding disorder.   Social history: Lives with boyfriend, son and daughter, works at [employer]  Denies smoking, drug use. Alcohol use social.  Diet: "horrible". Eats in front of the TV  Excerise: none   Immunizations: declines flu, Covid vaccines. Tdap UTD  Health maintenance:   Mammogram: N/A Colonoscopy: N/A Last Gynecological Exam: Last Menstrual cycle: 2 weeks ago  Last Dental Exam: UTD  Last Eye Exam: years   Wears seatbelt always, smoke detectors in home and functioning, does not text while driving and feels safe in home environment.   Reviewed allergies, medications, past medical, surgical, family, and social history.   Review of Systems Review of Systems Constitutional: -fever, -chills, -sweats, -unexpected weight change,-fatigue ENT: -runny nose, -ear pain, -sore throat Cardiology:  -chest pain, -palpitations, -edema Respiratory: -cough, -shortness of breath, -wheezing Gastroenterology: -abdominal pain, -nausea, -vomiting, -diarrhea,-constipation  Hematology: -bleeding or bruising problems Musculoskeletal: -arthralgias, -myalgias, -joint swelling, -back pain Ophthalmology: -vision changes Urology: -dysuria, -difficulty urinating, -hematuria, -urinary frequency, -urgency Neurology: -headache, -weakness, -tingling, -numbness       Objective:   Physical Exam BP 124/82   Pulse 72   Temp 98.6 F (37 C)   Ht 5\' 4"  (1.626 m)   Wt 186 lb (84.4 kg)   LMP 04/01/2021   BMI 31.93 kg/m   General Appearance:    Alert,  cooperative, no distress, appears stated age  Head:    Normocephalic, without obvious abnormality, atraumatic  Eyes:    PERRL, conjunctiva/corneas clear, EOM's intact  Ears:    Normal TM's and external ear canals  Nose:   Mask on   Throat:   Mask on   Neck:   Supple, no lymphadenopathy;  thyroid:  no   enlargement/tenderness/nodules; no JVD  Back:    Spine nontender, no curvature, ROM normal, no CVA     tenderness  Lungs:     Clear to auscultation bilaterally without wheezes, rales or     ronchi; respirations unlabored  Chest Wall:    No tenderness or deformity   Heart:    Regular rate and rhythm, S1 and S2 normal, no murmur, rub   or gallop  Breast Exam:    No tenderness, masses, or nipple discharge or inversion.      No axillary lymphadenopathy. Jewelry present in the right nipple.   Abdomen:     Soft, non-tender, nondistended, normoactive bowel sounds,    no masses, no hepatosplenomegaly  Genitalia:    Declines. Pap smear UTD  Rectal:    Not performed due to age<40 and no related complaints  Extremities:   No clubbing, cyanosis or edema  Pulses:   2+ and symmetric all extremities  Skin:   Skin color, texture, turgor normal, no rashes or lesions  Lymph nodes:   Cervical, supraclavicular, and axillary nodes normal  Neurologic:   CNII-XII intact, normal strength, sensation and gait          Psych:   Normal  mood, affect, hygiene and grooming.        Assessment & Plan:  Routine general medical examination at a health care facility  Encounter for BCP (birth control pills) initial prescription - Plan: norethindrone-ethinyl estradiol (MICROGESTIN) 1-20 MG-MCG tablet, POCT urine pregnancy  Obesity (BMI 30.0-34.9)  Immunization declined   Preventive healthcare reviewed. Counseling on healthy lifestyle including diet and exercise. Immunizations declined. Recommend weight loss. OCPs prescribed. She has taken them in the past. Discussed risk for thrombosis

## 2021-04-14 NOTE — Patient Instructions (Signed)
Preventive Care 21-36 Years Old, Female Preventive care refers to lifestyle choices and visits with your health care provider that can promote health and wellness. This includes: A yearly physical exam. This is also called an annual wellness visit. Regular dental and eye exams. Immunizations. Screening for certain conditions. Healthy lifestyle choices, such as: Eating a healthy diet. Getting regular exercise. Not using drugs or products that contain nicotine and tobacco. Limiting alcohol use. What can I expect for my preventive care visit? Physical exam Your health care provider may check your: Height and weight. These may be used to calculate your BMI (body mass index). BMI is a measurement that tells if you are at a healthy weight. Heart rate and blood pressure. Body temperature. Skin for abnormal spots. Counseling Your health care provider may ask you questions about your: Past medical problems. Family's medical history. Alcohol, tobacco, and drug use. Emotional well-being. Home life and relationship well-being. Sexual activity. Diet, exercise, and sleep habits. Work and work environment. Access to firearms. Method of birth control. Menstrual cycle. Pregnancy history. What immunizations do I need? Vaccines are usually given at various ages, according to a schedule. Your health care provider will recommend vaccines for you based on your age, medical history, and lifestyle or other factors, such as travel or where you work. What tests do I need? Blood tests Lipid and cholesterol levels. These may be checked every 5 years starting at age 20. Hepatitis C test. Hepatitis B test. Screening Diabetes screening. This is done by checking your blood sugar (glucose) after you have not eaten for a while (fasting). STD (sexually transmitted disease) testing, if you are at risk. BRCA-related cancer screening. This may be done if you have a family history of breast, ovarian, tubal, or  peritoneal cancers. Pelvic exam and Pap test. This may be done every 3 years starting at age 21. Starting at age 30, this may be done every 5 years if you have a Pap test in combination with an HPV test. Talk with your health care provider about your test results, treatment options, and if necessary, the need for more tests. Follow these instructions at home: Eating and drinking  Eat a healthy diet that includes fresh fruits and vegetables, whole grains, lean protein, and low-fat dairy products. Take vitamin and mineral supplements as recommended by your health care provider. Do not drink alcohol if: Your health care provider tells you not to drink. You are pregnant, may be pregnant, or are planning to become pregnant. If you drink alcohol: Limit how much you have to 0-1 drink a day. Be aware of how much alcohol is in your drink. In the [place] one drink equals one 12 oz bottle of beer (355 mL), one 5 oz glass of wine (148 mL), or one 1 oz glass of hard liquor (44 mL). Lifestyle Take daily care of your teeth and gums. Brush your teeth every morning and night with fluoride toothpaste. Floss one time each day. Stay active. Exercise for at least 30 minutes 5 or more days each week. Do not use any products that contain nicotine or tobacco, such as cigarettes, e-cigarettes, and chewing tobacco. If you need help quitting, ask your health care provider. Do not use drugs. If you are sexually active, practice safe sex. Use a condom or other form of protection to prevent STIs (sexually transmitted infections). If you do not wish to become pregnant, use a form of birth control. If you plan to become pregnant, see your health care provider   for a prepregnancy visit. Find healthy ways to cope with stress, such as: Meditation, yoga, or listening to music. Journaling. Talking to a trusted person. Spending time with friends and family. Safety Always wear your seat belt while driving or riding in a  vehicle. Do not drive: If you have been drinking alcohol. Do not ride with someone who has been drinking. When you are tired or distracted. While texting. Wear a helmet and other protective equipment during sports activities. If you have firearms in your house, make sure you follow all gun safety procedures. Seek help if you have been physically or sexually abused. What's next? Go to your health care provider once a year for an annual wellness visit. Ask your health care provider how often you should have your eyes and teeth checked. Stay up to date on all vaccines. This information is not intended to replace advice given to you by your health care provider. Make sure you discuss any questions you have with your health care provider. Document Revised: 09/25/2020 Document Reviewed: 03/29/2018 Elsevier Patient Education  2022 Elsevier Inc.  

## 2021-06-09 ENCOUNTER — Telehealth: Payer: Self-pay

## 2021-06-09 DIAGNOSIS — Z30011 Encounter for initial prescription of contraceptive pills: Secondary | ICD-10-CM

## 2021-06-09 MED ORDER — NORETHINDRONE ACET-ETHINYL EST 1-20 MG-MCG PO TABS: 1.0000 | ORAL_TABLET | Freq: Every day | ORAL | 1 refills | Status: DC

## 2021-06-09 NOTE — Telephone Encounter (Signed)
Refill request

## 2021-06-14 ENCOUNTER — Telehealth: Payer: Self-pay | Admitting: Family Medicine

## 2021-06-14 DIAGNOSIS — Z30011 Encounter for initial prescription of contraceptive pills: Secondary | ICD-10-CM

## 2021-06-14 MED ORDER — NORETHINDRONE ACET-ETHINYL EST 1-20 MG-MCG PO TABS: 1.0000 | ORAL_TABLET | Freq: Every day | ORAL | 2 refills | Status: DC

## 2021-06-14 NOTE — Telephone Encounter (Signed)
Pt called  and requested call back re BCP rx She said she called Friday because her insurance is making her get 90 day supply of BCP and new rx was sent in But she states ut is different from what she was getting. She doesn't know the bcp name but states Vickier just started her on it. She did state the new one was only 21 days and her previous rx was 28 day  Please call

## 2021-06-14 NOTE — Telephone Encounter (Signed)
Called and spoke to patient and said that she got another pack that said junel FE and wanted to know if it was the wrong one. I advised her we never refilled June FE for her that she needed to check with pharmacy to see if it was given by accident or manufactuer was changed. I have resent the one in for #90 days with 2 refills

## 2021-06-24 IMAGING — MR MR CERVICAL SPINE W/O CM
5 series · 34 of 48 positions shown · non-contrast
Comparison: None available.

CLINICAL DATA: Initial evaluation for cervical radiculopathy, left
shoulder, scapula, and arm pain.

EXAM:
MRI CERVICAL SPINE WITHOUT CONTRAST
TECHNIQUE: Multiplanar, multisequence MR imaging of the cervical spine was
performed. No intravenous contrast was administered.

[Series 2: T2 · sagittal · 3.0mm · 0.41mm/px · 8 of 17 slices shown (1 of 2)]
[im 1/17]
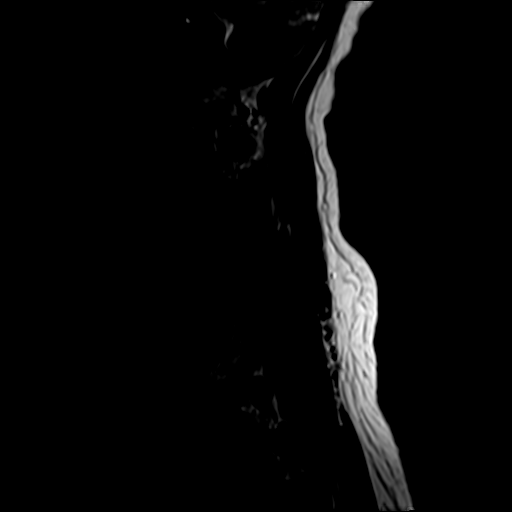
[im 3/17]
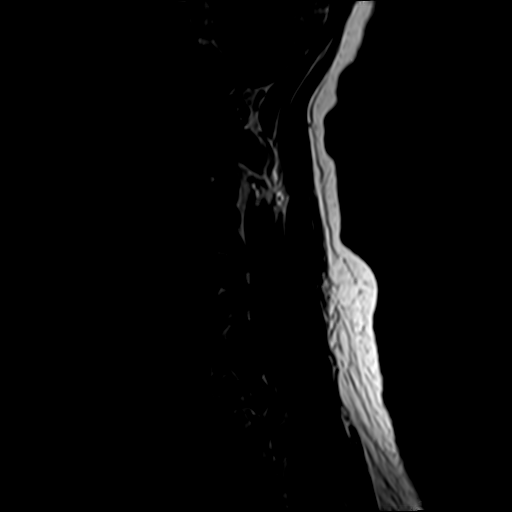
[im 5/17]
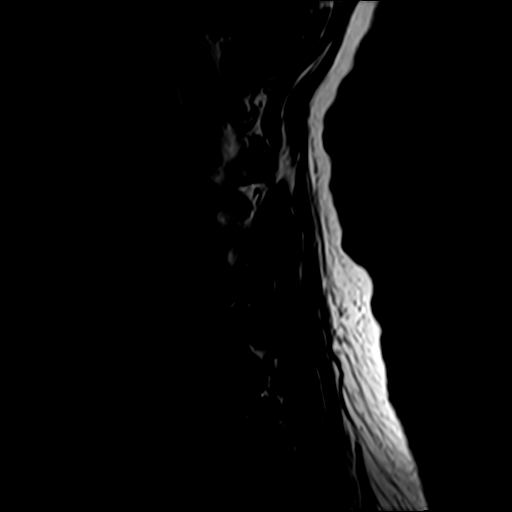
[im 7/17]
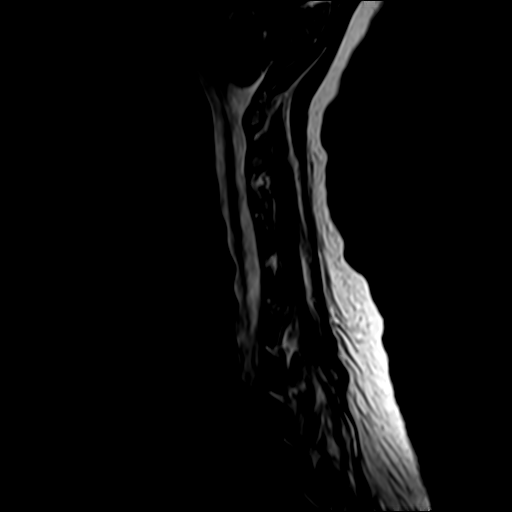
[im 10/17]
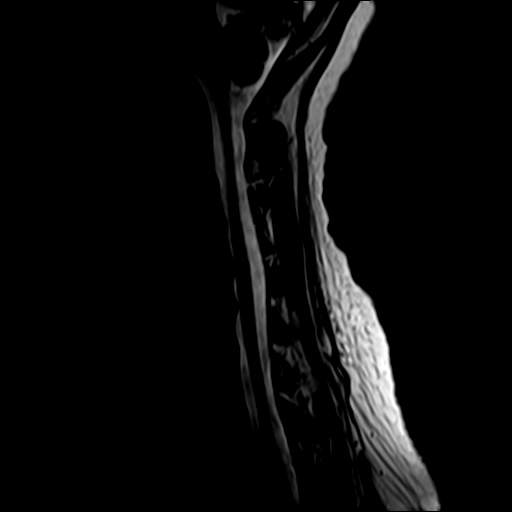
[im 12/17]
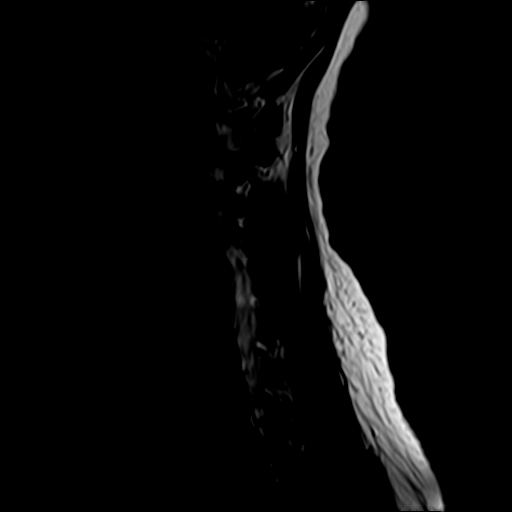
[im 14/17]
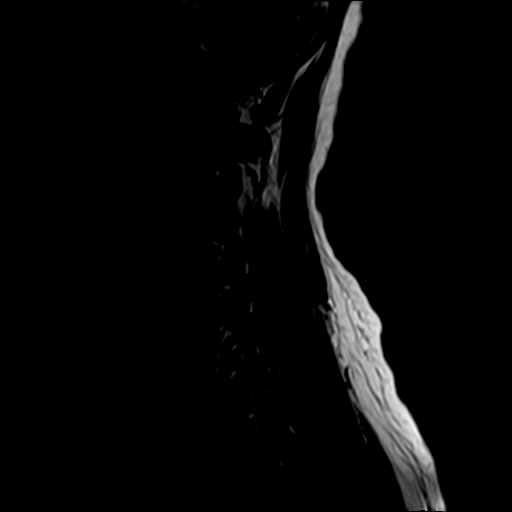
[im 17/17]
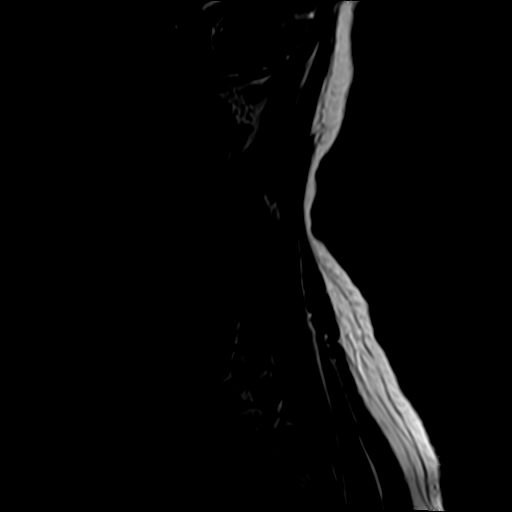

[Series 3: STIR · sagittal · 3.0mm · 0.82mm/px · 7 of 17 slices shown]
[im 1/17]
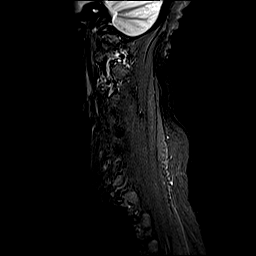
[im 3/17]
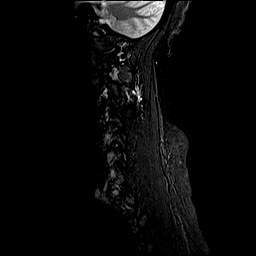
[im 6/17]
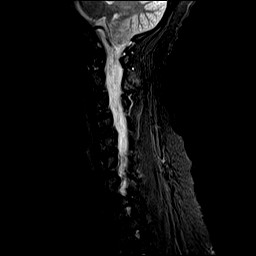
[im 9/17]
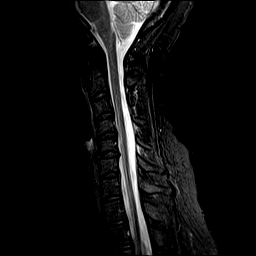
[im 11/17]
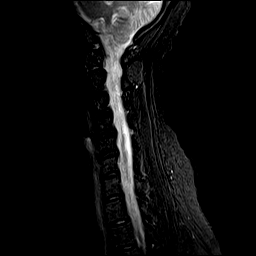
[im 14/17]
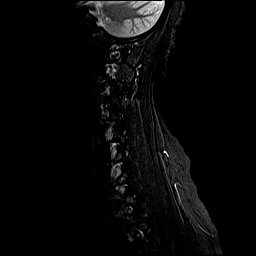
[im 17/17]
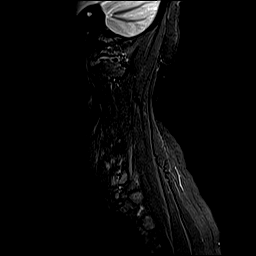

[Series 4: T1 · sagittal · 3.0mm · 0.82mm/px · 7 of 17 slices shown]
[im 1/17]
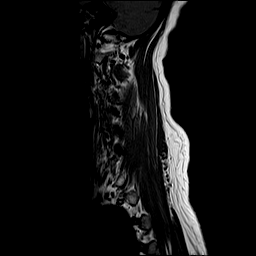
[im 3/17]
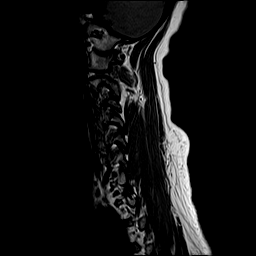
[im 6/17]
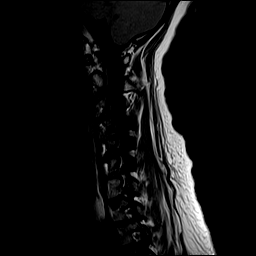
[im 9/17]
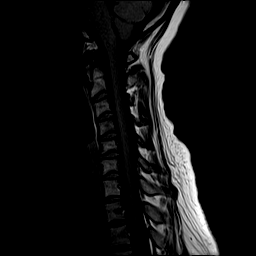
[im 11/17]
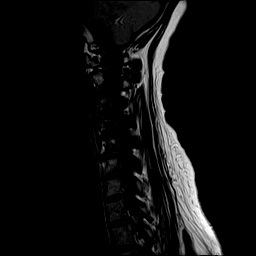
[im 14/17]
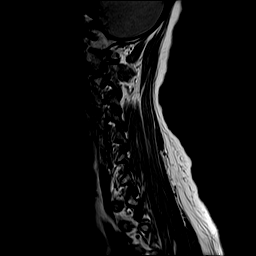
[im 17/17]
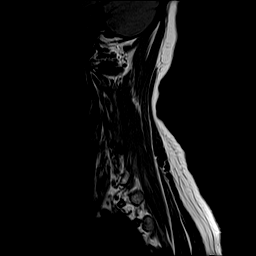

[Series 5: T2 · axial · 3.0mm · 0.70mm/px · z∈[-69,+43]mm · 9 of 32 slices shown (2 of 2)]
[im 1/32]
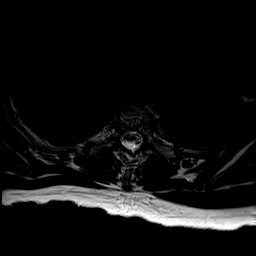
[im 6/32]
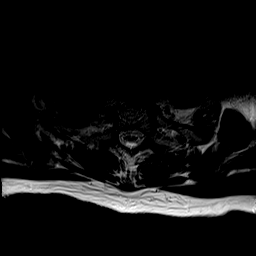
[im 11/32]
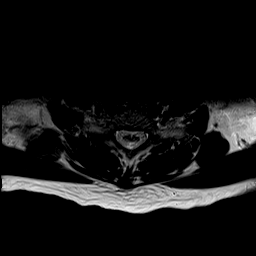
[im 13/32]
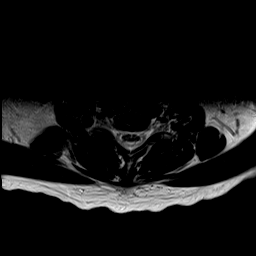
[im 16/32]
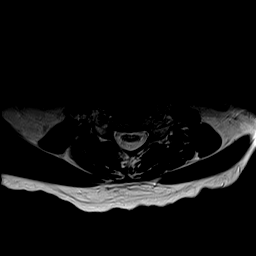
[im 19/32]
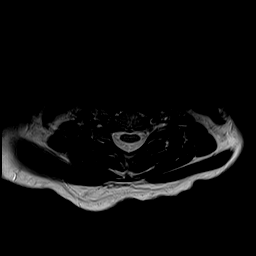
[im 21/32]
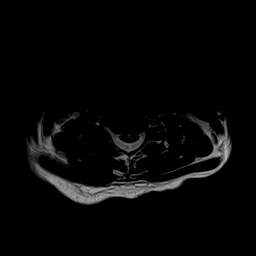
[im 26/32]
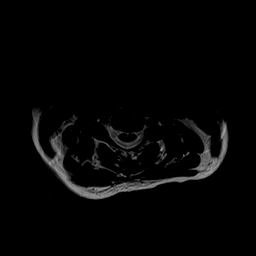
[im 32/32]
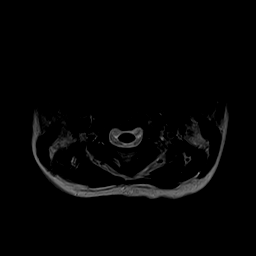

[Series 6: GRE · axial · 3.0mm · 0.35mm/px · z∈[-69,-33]mm · 3 of 32 slices shown]
[im 1/32]
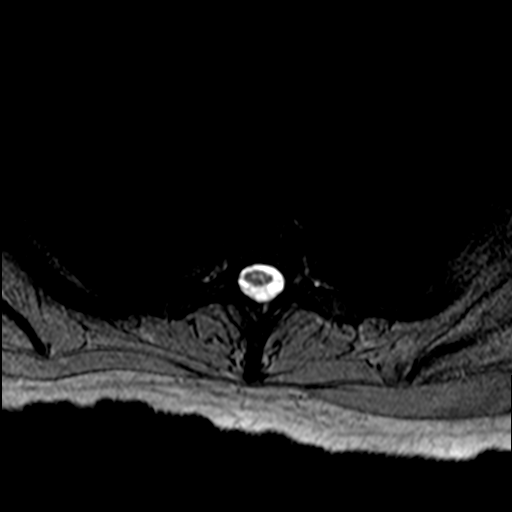
[im 6/32]
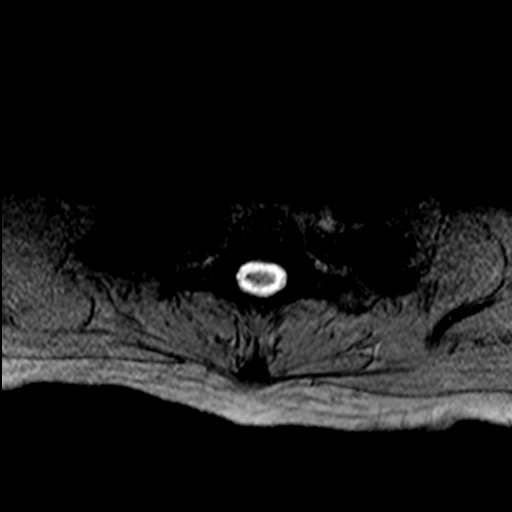
[im 11/32]
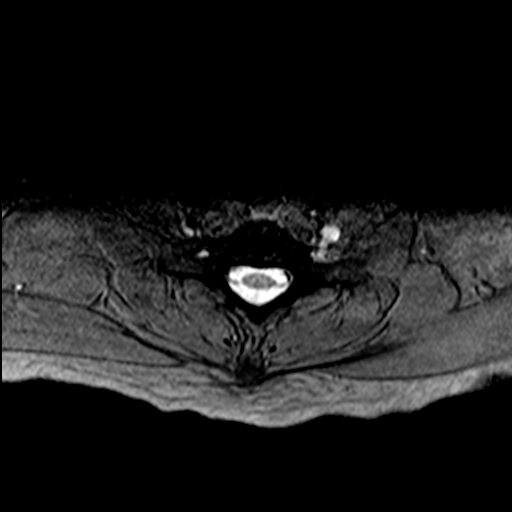

[34 of 48 positions shown; findings below may reference images not displayed]

FINDINGS: Alignment: Examination mildly degraded by motion artifact.

Straightening of the normal cervical lordosis.  No listhesis.

Vertebrae: Vertebral body height maintained without acute or chronic
fracture. Bone marrow signal intensity within normal limits. No
discrete or worrisome osseous lesions. No abnormal marrow edema.

Cord: Normal signal and morphology.

Posterior Fossa, vertebral arteries, paraspinal tissues: Visualized
brain and posterior fossa within normal limits. Craniocervical
junction normal. Paraspinous and prevertebral soft tissues within
normal limits. Normal intravascular flow voids seen within the
vertebral arteries bilaterally.

Disc levels:

C2-C3: Unremarkable.

C3-C4: Mild diffuse disc bulge. Minimal flattening of the ventral
thecal sac without significant spinal stenosis. Foramina remain
patent.

C4-C5: Mild disc bulge with uncovertebral hypertrophy. Mild
flattening of the ventral thecal sac without significant spinal
stenosis. Mild left C5 foraminal narrowing. Right neural foramina
remains patent.

C5-C6: Mild disc bulge with uncovertebral hypertrophy. Flattening of
the ventral thecal sac without significant spinal stenosis. Mild
right C6 foraminal narrowing. Left neural foramen remains patent.

C6-C7:  Minimal annular disc bulge.  No canal or foraminal stenosis.

C7-T1:  Unremarkable.

Visualized upper thoracic spine demonstrates no significant finding.
IMPRESSION: 1. Mild noncompressive disc bulging at C3-4 through C6-7 without
significant spinal stenosis.
2. Mild left C5 and right C6 foraminal narrowing related to disc
bulge and uncovertebral disease. No other significant foraminal
encroachment within the cervical spine.

## 2022-04-06 ENCOUNTER — Encounter: Payer: Self-pay | Admitting: Internal Medicine

## 2022-04-19 ENCOUNTER — Encounter: Payer: PRIVATE HEALTH INSURANCE | Admitting: Family Medicine

## 2022-05-10 ENCOUNTER — Encounter: Payer: Self-pay | Admitting: Internal Medicine

## 2022-06-14 ENCOUNTER — Encounter: Payer: Self-pay | Admitting: Internal Medicine

## 2024-02-27 ENCOUNTER — Encounter: Payer: Self-pay | Admitting: Medical

## 2024-02-27 ENCOUNTER — Ambulatory Visit (INDEPENDENT_AMBULATORY_CARE_PROVIDER_SITE_OTHER): Payer: PRIVATE HEALTH INSURANCE | Admitting: Medical

## 2024-02-27 ENCOUNTER — Other Ambulatory Visit (HOSPITAL_COMMUNITY)
Admission: RE | Admit: 2024-02-27 | Discharge: 2024-02-27 | Disposition: A | Discharge status: Home / Self Care | Source: Ambulatory Visit | Attending: Medical | Admitting: Medical

## 2024-02-27 VITALS — BP 128/74 | HR 70 | Ht 65.0 in | Wt 181.8 lb

## 2024-02-27 DIAGNOSIS — Z131 Encounter for screening for diabetes mellitus: Secondary | ICD-10-CM

## 2024-02-27 DIAGNOSIS — Z13220 Encounter for screening for lipoid disorders: Secondary | ICD-10-CM

## 2024-02-27 DIAGNOSIS — Z0000 Encounter for general adult medical examination without abnormal findings: Secondary | ICD-10-CM | POA: Insufficient documentation

## 2024-02-27 DIAGNOSIS — Z1389 Encounter for screening for other disorder: Secondary | ICD-10-CM | POA: Diagnosis not present

## 2024-02-27 DIAGNOSIS — Z7185 Encounter for immunization safety counseling: Secondary | ICD-10-CM

## 2024-02-27 DIAGNOSIS — Z309 Encounter for contraceptive management, unspecified: Secondary | ICD-10-CM | POA: Diagnosis not present

## 2024-02-27 DIAGNOSIS — M7712 Lateral epicondylitis, left elbow: Secondary | ICD-10-CM

## 2024-02-27 DIAGNOSIS — Z124 Encounter for screening for malignant neoplasm of cervix: Secondary | ICD-10-CM | POA: Insufficient documentation

## 2024-02-27 DIAGNOSIS — Z136 Encounter for screening for cardiovascular disorders: Secondary | ICD-10-CM

## 2024-02-27 LAB — POCT URINE PREGNANCY: Preg Test, Ur: NEGATIVE

## 2024-02-27 LAB — POCT URINALYSIS DIP (PROADVANTAGE DEVICE)
Bilirubin, UA: NEGATIVE
Blood, UA: NEGATIVE
Glucose, UA: NEGATIVE mg/dL
Ketones, POC UA: NEGATIVE mg/dL
Nitrite, UA: NEGATIVE
Protein Ur, POC: NEGATIVE mg/dL
Specific Gravity, Urine: 1.015
Urobilinogen, Ur: 0.2
pH, UA: 6 (ref 5.0–8.0)

## 2024-02-27 LAB — LIPID PANEL

## 2024-02-27 MED ORDER — RIVELSA 42-21-21-7 DAYS PO TABS: 1.0000 | ORAL_TABLET | Freq: Every day | ORAL | 3 refills | Status: AC

## 2024-02-27 NOTE — Progress Notes (Signed)
 Subjective:   HPI  Jody Brady is a 38 y.o. female who presents for Chief Complaint  Patient presents with   Annual Exam    Fasting annual exam with pap. Would like to restart OCP's. Left arm pain, elbow area x 3-4 weeks only when she lifts things-aches sometimes.    Patient Care Team: [place] Dost, Alm GORMAN RIGGERS as PCP - General (Family Medicine) Dentist   Concerns: Lately has had some problems of left elbow hurting.  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  No swelling.  No relieving or aggravating factors.  She is right-handed   Gynecological history: 5 pregnancies, 2 live births, 3 abortion. Periods are regular, periods heavy. Patient sees gynecology for gynecology care.  Periods are regular, can be quite heavy.   Wants to get back on contraception for heavy periods and pregnancy prevention.  Was on Depo Provera for years, at least 5 years.  She wants to get back on OCPs.  LMP 02/03/2024   Reviewed their medical, surgical, family, social, medication, and allergy history and updated chart as appropriate.  No Known Allergies  History reviewed. No pertinent past medical history.  Current Outpatient Medications on File Prior to Visit  Medication Sig Dispense Refill   norethindrone -ethinyl estradiol (MICROGESTIN) 1-20 MG-MCG tablet Take 1 tablet by mouth daily. (Patient not taking: Reported on 02/27/2024) 84 tablet 2   No current facility-administered medications on file prior to visit.      Current Outpatient Medications:    Levonorgest-Eth Est & Eth Est (RIVELSA ) 42-21-21-7 DAYS TABS, Take 1 tablet by mouth daily., Disp: 91 tablet, Rfl: 3   norethindrone -ethinyl estradiol (MICROGESTIN) 1-20 MG-MCG tablet, Take 1 tablet by mouth daily. (Patient not taking: Reported on 02/27/2024), Disp: 84 tablet, Rfl: 2  Family History  Problem Relation Age of Onset   Heart disease Father 38   Psoriasis Father    Lupus Sister    Heart disease Maternal  Uncle    Heart disease Maternal Grandmother     Past Surgical History:  Procedure Laterality Date   NO PAST SURGERIES  01/2024     Review of Systems  Constitutional:  Negative for chills, fever, malaise/fatigue and weight loss.  HENT:  Negative for congestion, ear pain, hearing loss, sore throat and tinnitus.   Eyes:  Negative for blurred vision, pain and redness.  Respiratory:  Negative for cough, hemoptysis and shortness of breath.   Cardiovascular:  Negative for chest pain, palpitations, orthopnea, claudication and leg swelling.  Gastrointestinal:  Negative for abdominal pain, blood in stool, constipation, diarrhea, nausea and vomiting.  Genitourinary:  Negative for dysuria, flank pain, frequency, hematuria and urgency.  Musculoskeletal:  Positive for joint pain. Negative for falls and myalgias.  Skin:  Negative for itching and rash.  Neurological:  Negative for dizziness, tingling, speech change, weakness and headaches.  Endo/Heme/Allergies:  Negative for polydipsia. Does not bruise/bleed easily.  Psychiatric/Behavioral:  Negative for depression and memory loss. The patient is not nervous/anxious and does not have insomnia.         Objective:  BP 128/74   Pulse 70   Ht 5' 5 (1.651 m)   Wt 181 lb 12.8 oz (82.5 kg)   LMP 02/03/2024   SpO2 98%   BMI 30.25 kg/m    General appearance: alert, no distress, WD/WN, African American female Skin: unremarkable HEENT: normocephalic, conjunctiva/corneas normal, sclerae anicteric, PERRLA, EOMi, nares patent, no discharge or erythema, pharynx normal Oral cavity: MMM,  tongue normal, teeth normal Neck: supple, no lymphadenopathy, no thyromegaly, no masses, normal ROM, no bruits Chest: non tender, normal shape and expansion Heart: RRR, normal S1, S2, no murmurs Lungs: CTA bilaterally, no wheezes, rhonchi, or rales Abdomen: +bs, soft, non tender, non distended, no masses, no hepatomegaly, no splenomegaly, no bruits Back: non tender,  normal ROM, no scoliosis Musculoskeletal: + Tender over left lateral malleolus, otherwise nontender, no swelling, no deformity, pain with resisted supination of the left arm,  otherwise upper extremities non tender, no obvious deformity, normal ROM throughout, lower extremities non tender, no obvious deformity, normal ROM throughout Extremities: no edema, no cyanosis, no clubbing Pulses: 2+ symmetric, upper and lower extremities, normal cap refill Neurological: alert, oriented x 3, CN2-12 intact, strength normal upper extremities and lower extremities, sensation normal throughout, DTRs 2+ throughout, no cerebellar signs, gait normal Psychiatric: normal affect, behavior normal, pleasant   Breast: nontender, small glandular cystic 3mm diameter area of right medial breast at 3oclock, otherwise no masses or lumps, no skin changes, no nipple discharge or inversion, no axillary lymphadenopathy Gyn: Normal external genitalia without lesions, vagina with normal mucosa, cervix without lesions, no cervical motion tenderness, no abnormal vaginal discharge.  Uterus and adnexa not enlarged, nontender, no masses.  Pap performed.  Exam chaperoned by nurse. Rectal: deferred   Assessment and Plan :   Encounter Diagnoses  Name Primary?   Encounter for health maintenance examination in adult Yes   Screening for hematuria or proteinuria    Vaccine counseling    Screening for cervical cancer    Encounter for lipid screening for cardiovascular disease    Encounter for contraceptive management, unspecified type    Lateral epicondylitis of left elbow    Screening for diabetes mellitus      This visit was a preventative care visit, also known as wellness visit or routine physical.   Topics typically include healthy lifestyle, diet, exercise, preventative care, vaccinations, sick and well care, proper use of emergency dept and after hours care, as well as other concerns.    Separate significant issues  discussed: Lateral epicondyulitits -advised Aleve  over-the-counter for the next week, rest of the arm if possible, consider arm sling for the day for the next week or 2, cold therapy.  If not much improved in the next 2 weeks then recheck  Discussed risk and benefits of contraception.  She wants to use oral OCP.  Begin OCP as below   General Recommendations: Continue to return yearly for your annual wellness and preventative care visits.  This gives us  a chance to discuss healthy lifestyle, exercise, vaccinations, review your chart record, and perform screenings where appropriate.  I recommend you see your eye doctor yearly for routine vision care.  I recommend you see your dentist yearly for routine dental care including hygiene visits twice yearly.   Vaccination recommendations were reviewed Immunization History  Administered Date(s) Administered   DTaP 07/18/1990   HIB (PRP-OMP) 04/07/1988   Hepatitis B, PED/ADOLESCENT 05/13/1996, 06/13/1996, 11/26/1996   IPV 07/18/1990   MMR 07/18/1990   Tdap 06/05/2019    Vaccine recommendations: Yearly flu shot Consider HPV vaccine   Screening for cancer: Colon cancer screening: Age 30   Breast cancer screening: You should perform a self breast exam monthly.   We reviewed recommendations for regular mammograms and breast cancer screening. Last mammogram: age 5  I recommended she monitor the area of the right breast that was palpated today.  I suspect it is benign.  If it  is getting bigger or persistent she will recheck for exam and mammogram  Cervical cancer screening: We reviewed recommendations for pap smear screening. Pap updated today   Skin cancer screening: Check your skin regularly for new changes, growing lesions, or other lesions of concern Come in for evaluation if you have skin lesions of concern.  Lung cancer screening: If you have a greater than 20 pack year history of tobacco use, then you may qualify for lung  cancer screening with a chest CT scan.   Please call your insurance company to inquire about coverage for this test.  Pancreatic cancer: no current screening test is available routinely recommended.  (Risk factors: Smoking, overweight or obese, diabetes, chronic pancreatitis, work Nurse, mental health, Solicitor, 61 year old or greater, female greater than female, African-American, family history of pancreatic cancer, hereditary breast, ovarian, melanoma, Lynch, Peutz-jeghers).  We currently don't have screenings for other cancers besides breast, cervical, colon, and lung cancers.  If you have a strong family history of cancer or have other cancer screening concerns, please let me know.    Bone health: Get at least 150 minutes of aerobic exercise weekly Get weight bearing exercise at least once weekly Bone density test:  A bone density test is an imaging test that uses a type of X-ray to measure the amount of calcium and other minerals in your bones. The test may be used to diagnose or screen you for a condition that causes weak or thin bones (osteoporosis), predict your risk for a broken bone (fracture), or determine how well your osteoporosis treatment is working. The bone density test is recommended for females 65 and older, or females or males <65 if certain risk factors such as thyroid disease, long term use of steroids such as for asthma or rheumatological issues, vitamin D deficiency, estrogen deficiency, family history of osteoporosis, self or family history of fragility fracture in first degree relative.    Heart health: Get at least 150 minutes of aerobic exercise weekly Limit alcohol It is important to maintain a healthy blood pressure and healthy cholesterol numbers  Heart disease screening: Screening for heart disease includes screening for blood pressure, fasting lipids, glucose/diabetes screening, BMI height to weight ratio, reviewed of smoking status, physical activity, and  diet.    Goals include blood pressure 120/80 or less, maintaining a healthy lipid/cholesterol profile, preventing diabetes or keeping diabetes numbers under good control, not smoking or using tobacco products, exercising most days per week or at least 150 minutes per week of exercise, and eating healthy variety of fruits and vegetables, healthy oils, and avoiding unhealthy food choices like fried food, fast food, high sugar and high cholesterol foods.    Other tests may possibly include EKG test, CT coronary calcium score, echocardiogram, exercise treadmill stress test.    Heart disease testing completed: EKG today   Vascular disease screening: For high risk individuals including smokers, diabetes, patients with known heart disease or high blood pressure, kidney disease, and others, screening for vascular disease or atherosclerosis of the arteries is available.  Examples may include carotid ultrasound, abdominal aortic ultrasound, ABI blood flow screening in the legs, thoracic aorta screening.   Medical care options: I recommend you continue to seek care here first for routine care.  We try really hard to have available appointments Monday through Friday daytime hours for sick visits, acute visits, and physicals.  Urgent care should be used for after hours and weekends for significant issues that cannot wait till the next day.  The emergency  department should be used for significant potentially life-threatening emergencies.  The emergency department is expensive, can often have long wait times for less significant concerns, so try to utilize primary care, urgent care, or telemedicine when possible to avoid unnecessary trips to the emergency department.  Virtual visits and telemedicine have been introduced since the pandemic started in 2020, and can be convenient ways to receive medical care.  We offer virtual appointments as well to assist you in a variety of options to seek medical  care.   Legal Take the time to do a Last Will and Testament, advanced directives including Healthcare Power of Attorney and Living Will documents.  Do not leave your family with burdens that can be handled ahead of time.   Advanced Directives: I recommend you consider completing a Health Care Power of Attorney and Living Will.   These documents respect your wishes and help alleviate burdens on your loved ones if you were to become terminally ill or be in a position to need those documents enforced.    You can complete Advanced Directives yourself, have them notarized, then have copies made for our office, for you and for anybody you feel should have them in safe keeping.  Or, you can have an attorney prepare these documents.   If you haven't updated your Last Will and Testament in a while, it may be worthwhile having an attorney prepare these documents together and save on some costs.       Spiritual and Emotional Health Keeping a healthy spiritual life can help you better manage your physical health. Your spiritual life can help you to cope with any issues that may arise with your physical health.  Balance can keep us  healthy and help us  to recover.  If you are struggling with your spiritual health there are questions that you may want to ask yourself:  What makes me feel most complete? When do I feel most connected to the rest of the world? Where do I find the most inner strength? What am I doing when I feel whole?  Helpful tips: Being in nature. Some people feel very connected and at peace when they are walking outdoors or are outside. Helping others. Some feel the largest sense of wellbeing when they are of service to others. Being of service can take on many forms. It can be doing volunteer work, being kind to strangers, or offering a hand to a friend in need. Gratitude. Some people find they feel the most connected when they remain grateful. They may make lists of all the things  they are grateful for or say a thank you out loud for all they have.    Emotional Health Are you in tune with your emotional health?  Check out this link: http://www.marquez-love.com/   Financial Health Make sure you use a budget for your personal finances Make sure you are insured against risks (health insurance, life insurance, auto insurance, etc) Save more, spend less Set financial goals If you need help in this area, good resources include counseling through [employer] or other community resources, have a meeting with a Social research officer, government, and a good resource is Deatrice Shoulder podcast    Brookelynne was seen today for annual exam.  Diagnoses and all orders for this visit:  Encounter for health maintenance examination in adult -     CBC -     Comprehensive metabolic panel with GFR -     Lipid panel -     TSH -  Hemoglobin A1c -     Hepatitis C antibody -     Cytology - PAP([place]) -     POCT urine pregnancy -     POCT Urinalysis DIP (Proadvantage Device)  Screening for hematuria or proteinuria -     POCT Urinalysis DIP (Proadvantage Device)  Vaccine counseling  Screening for cervical cancer -     Cytology - PAP([place])  Encounter for lipid screening for cardiovascular disease -     Lipid panel  Encounter for contraceptive management, unspecified type -     POCT urine pregnancy  Lateral epicondylitis of left elbow  Screening for diabetes mellitus -     Hemoglobin A1c  Other orders -     Levonorgest-Eth Est & Eth Est (RIVELSA ) 42-21-21-7 DAYS TABS; Take 1 tablet by mouth daily.    Follow-up pending labs, yearly for physical

## 2024-02-28 ENCOUNTER — Ambulatory Visit: Payer: Self-pay | Admitting: Medical

## 2024-02-28 LAB — COMPREHENSIVE METABOLIC PANEL WITH GFR
ALT: 18 IU/L (ref 0–32)
AST: 21 IU/L (ref 0–40)
Albumin: 4.2 g/dL (ref 3.9–4.9)
Alkaline Phosphatase: 63 IU/L (ref 44–121)
BUN/Creatinine Ratio: 22 (ref 9–23)
BUN: 15 mg/dL (ref 6–20)
Bilirubin Total: 0.3 mg/dL (ref 0.0–1.2)
CO2: 18 mmol/L — AB (ref 20–29)
Calcium: 8.9 mg/dL (ref 8.7–10.2)
Chloride: 104 mmol/L (ref 96–106)
Creatinine, Ser: 0.69 mg/dL (ref 0.57–1.00)
Globulin, Total: 2.3 g/dL (ref 1.5–4.5)
Glucose: 83 mg/dL (ref 70–99)
Potassium: 3.9 mmol/L (ref 3.5–5.2)
Sodium: 137 mmol/L (ref 134–144)
Total Protein: 6.5 g/dL (ref 6.0–8.5)
eGFR: 114 mL/min/1.73 (ref >59)

## 2024-02-28 LAB — LIPID PANEL
Cholesterol, Total: 144 mg/dL (ref 100–199)
HDL: 62 mg/dL (ref >39)
LDL CALC COMMENT:: 2.3 ratio (ref 0.0–4.4)
LDL Chol Calc (NIH): 71 mg/dL (ref 0–99)
Triglycerides: 47 mg/dL (ref 0–149)
VLDL Cholesterol Cal: 11 mg/dL (ref 5–40)

## 2024-02-28 LAB — CBC
Hematocrit: 39.1 % (ref 34.0–46.6)
Hemoglobin: 12.2 g/dL (ref 11.1–15.9)
MCH: 28.6 pg (ref 26.6–33.0)
MCHC: 31.2 g/dL — ABNORMAL LOW (ref 31.5–35.7)
MCV: 92 fL (ref 79–97)
Platelets: 254 x10E3/uL (ref 150–450)
RBC: 4.27 x10E6/uL (ref 3.77–5.28)
RDW: 13.7 % (ref 11.7–15.4)
WBC: 5.6 x10E3/uL (ref 3.4–10.8)

## 2024-02-28 LAB — TSH: TSH: 1.21 u[IU]/mL (ref 0.450–4.500)

## 2024-02-28 LAB — HEMOGLOBIN A1C
Est. average glucose Bld gHb Est-mCnc: 117 mg/dL
Hgb A1c MFr Bld: 5.7 — AB (ref 4.8–5.6)

## 2024-02-28 LAB — HEPATITIS C ANTIBODY: Hep C Virus Ab: NONREACTIVE

## 2024-02-28 NOTE — Progress Notes (Signed)
 Results sent through MyChart

## 2024-02-29 ENCOUNTER — Telehealth: Payer: Self-pay | Admitting: Internal Medicine

## 2024-02-29 DIAGNOSIS — Z30011 Encounter for initial prescription of contraceptive pills: Secondary | ICD-10-CM

## 2024-02-29 MED ORDER — NORETHINDRONE ACET-ETHINYL EST 1-20 MG-MCG PO TABS: 1.0000 | ORAL_TABLET | Freq: Every day | ORAL | 2 refills | Status: AC

## 2024-02-29 NOTE — Telephone Encounter (Signed)
 Sent in to [facility]  Copied from CRM [id number]. Topic: Clinical - Prescription Issue >> Feb 28, 2024  4:55 PM Graeme ORN wrote: Reason for CRM: Patient called. States [facility] not able to fill Rx. They don't do 90 days. Need to get changed to different Pharmacy. Would like sent to [facility]. [facility] ([facility] ) 42-21-21-7 DAYS TABS Thank You

## 2024-03-03 LAB — CYTOLOGY - PAP
Comment: NEGATIVE
Diagnosis: NEGATIVE
High risk HPV: NEGATIVE

## 2024-03-04 NOTE — Progress Notes (Signed)
Results through My Chart

## 2025-03-10 ENCOUNTER — Encounter: Payer: Self-pay | Admitting: Medical
